# Patient Record
Sex: Male | Born: 1950 | Race: White | Hispanic: No | Marital: Married | Smoking: Current every day smoker
Health system: Southern US, Community
[De-identification: ages and names within clinical notes are randomized; demographics above are authoritative.]

## PROBLEM LIST (undated history)

## (undated) DIAGNOSIS — I2510 Atherosclerotic heart disease of native coronary artery without angina pectoris: Secondary | ICD-10-CM

## (undated) DIAGNOSIS — J449 Chronic obstructive pulmonary disease, unspecified: Secondary | ICD-10-CM

## (undated) DIAGNOSIS — F419 Anxiety disorder, unspecified: Secondary | ICD-10-CM

## (undated) DIAGNOSIS — I714 Abdominal aortic aneurysm, without rupture, unspecified: Secondary | ICD-10-CM

## (undated) DIAGNOSIS — D649 Anemia, unspecified: Secondary | ICD-10-CM

## (undated) DIAGNOSIS — E7800 Pure hypercholesterolemia, unspecified: Secondary | ICD-10-CM

## (undated) DIAGNOSIS — F17200 Nicotine dependence, unspecified, uncomplicated: Secondary | ICD-10-CM

## (undated) DIAGNOSIS — Z87898 Personal history of other specified conditions: Secondary | ICD-10-CM

## (undated) DIAGNOSIS — R0602 Shortness of breath: Secondary | ICD-10-CM

## (undated) DIAGNOSIS — I739 Peripheral vascular disease, unspecified: Secondary | ICD-10-CM

## (undated) DIAGNOSIS — C140 Malignant neoplasm of pharynx, unspecified: Secondary | ICD-10-CM

## (undated) DIAGNOSIS — F32A Depression, unspecified: Secondary | ICD-10-CM

## (undated) DIAGNOSIS — E611 Iron deficiency: Secondary | ICD-10-CM

## (undated) DIAGNOSIS — R011 Cardiac murmur, unspecified: Secondary | ICD-10-CM

## (undated) DIAGNOSIS — F329 Major depressive disorder, single episode, unspecified: Secondary | ICD-10-CM

## (undated) DIAGNOSIS — I219 Acute myocardial infarction, unspecified: Secondary | ICD-10-CM

## (undated) DIAGNOSIS — K8020 Calculus of gallbladder without cholecystitis without obstruction: Secondary | ICD-10-CM

## (undated) DIAGNOSIS — I10 Essential (primary) hypertension: Secondary | ICD-10-CM

## (undated) DIAGNOSIS — Z77120 Contact with and (suspected) exposure to mold (toxic): Secondary | ICD-10-CM

## (undated) HISTORY — DX: Atherosclerotic heart disease of native coronary artery without angina pectoris: I25.10

## (undated) HISTORY — DX: Major depressive disorder, single episode, unspecified: F32.9

## (undated) HISTORY — DX: Personal history of other specified conditions: Z87.898

## (undated) HISTORY — DX: Essential (primary) hypertension: I10

## (undated) HISTORY — DX: Depression, unspecified: F32.A

## (undated) HISTORY — DX: Nicotine dependence, unspecified, uncomplicated: F17.200

## (undated) HISTORY — DX: Chronic obstructive pulmonary disease, unspecified: J44.9

## (undated) HISTORY — PX: OTHER SURGICAL HISTORY: SHX169

## (undated) HISTORY — DX: Pure hypercholesterolemia, unspecified: E78.00

## (undated) HISTORY — DX: Anxiety disorder, unspecified: F41.9

## (undated) HISTORY — DX: Iron deficiency: E61.1

## (undated) HISTORY — DX: Peripheral vascular disease, unspecified: I73.9

## (undated) HISTORY — DX: Abdominal aortic aneurysm, without rupture: I71.4

## (undated) HISTORY — DX: Contact with and (suspected) exposure to mold (toxic): Z77.120

## (undated) HISTORY — DX: Malignant neoplasm of pharynx, unspecified: C14.0

## (undated) HISTORY — PX: ABDOMINAL AORTIC ANEURYSM REPAIR: SUR1152

## (undated) HISTORY — DX: Calculus of gallbladder without cholecystitis without obstruction: K80.20

## (undated) HISTORY — DX: Abdominal aortic aneurysm, without rupture, unspecified: I71.40

## (undated) HISTORY — PX: TONSILLECTOMY: SUR1361

---

## 2006-01-24 ENCOUNTER — Ambulatory Visit: Payer: Self-pay

## 2007-07-31 ENCOUNTER — Ambulatory Visit: Payer: Self-pay | Admitting: Family Medicine

## 2008-06-24 ENCOUNTER — Ambulatory Visit: Payer: Self-pay | Admitting: Family Medicine

## 2009-08-12 ENCOUNTER — Ambulatory Visit: Payer: Self-pay | Admitting: Family Medicine

## 2010-05-12 LAB — PULMONARY FUNCTION TEST

## 2010-12-14 ENCOUNTER — Ambulatory Visit: Payer: Self-pay | Admitting: Ophthalmology

## 2010-12-20 ENCOUNTER — Ambulatory Visit: Payer: Self-pay | Admitting: Ophthalmology

## 2010-12-20 HISTORY — PX: CATARACT EXTRACTION: SUR2

## 2011-01-24 HISTORY — PX: ANGIOPLASTY / STENTING ILIAC: SUR31

## 2011-02-03 ENCOUNTER — Ambulatory Visit: Payer: Self-pay | Admitting: Vascular Surgery

## 2011-02-03 LAB — BASIC METABOLIC PANEL
Anion Gap: 11 (ref 7–16)
Calcium, Total: 9.2 mg/dL (ref 8.5–10.1)
Chloride: 101 mmol/L (ref 98–107)
Co2: 27 mmol/L (ref 21–32)
Creatinine: 0.69 mg/dL (ref 0.60–1.30)
Glucose: 100 mg/dL — ABNORMAL HIGH (ref 65–99)
Potassium: 4 mmol/L (ref 3.5–5.1)
Sodium: 139 mmol/L (ref 136–145)

## 2011-02-03 LAB — CBC
HCT: 49.3 % (ref 40.0–52.0)
HGB: 16.7 g/dL (ref 13.0–18.0)
MCV: 96 fL (ref 80–100)
WBC: 10.7 10*3/uL — ABNORMAL HIGH (ref 3.8–10.6)

## 2011-02-10 ENCOUNTER — Inpatient Hospital Stay: Payer: Self-pay | Admitting: Vascular Surgery

## 2011-02-10 HISTORY — PX: VEIN SURGERY: SHX48

## 2011-02-10 LAB — BASIC METABOLIC PANEL
Anion Gap: 10 (ref 7–16)
BUN: 7 mg/dL (ref 7–18)
Co2: 25 mmol/L (ref 21–32)
Creatinine: 0.73 mg/dL (ref 0.60–1.30)
EGFR (African American): >60
EGFR (Non-African Amer.): >60
Glucose: 140 mg/dL — ABNORMAL HIGH (ref 65–99)
Potassium: 4.5 mmol/L (ref 3.5–5.1)
Sodium: 143 mmol/L (ref 136–145)

## 2011-02-11 LAB — COMPREHENSIVE METABOLIC PANEL
Albumin: 2.9 g/dL — ABNORMAL LOW (ref 3.4–5.0)
Alkaline Phosphatase: 58 U/L (ref 50–136)
BUN: 7 mg/dL (ref 7–18)
Bilirubin,Total: 0.7 mg/dL (ref 0.2–1.0)
Creatinine: 0.64 mg/dL (ref 0.60–1.30)
EGFR (Non-African Amer.): >60
Glucose: 125 mg/dL — ABNORMAL HIGH (ref 65–99)
Osmolality: 284 (ref 275–301)
SGPT (ALT): 10 U/L — ABNORMAL LOW
Sodium: 143 mmol/L (ref 136–145)
Total Protein: 5.7 g/dL — ABNORMAL LOW (ref 6.4–8.2)

## 2011-02-11 LAB — MAGNESIUM: Magnesium: 2 mg/dL

## 2011-02-11 LAB — CBC WITH DIFFERENTIAL/PLATELET
Basophil %: 0.3 %
Eosinophil %: 1.2 %
HGB: 13.3 g/dL (ref 13.0–18.0)
Lymphocyte %: 13.3 %
MCH: 32.4 pg (ref 26.0–34.0)
MCV: 95 fL (ref 80–100)
Monocyte #: 1.4 10*3/uL — ABNORMAL HIGH (ref 0.0–0.7)
Neutrophil %: 74.5 %
RBC: 4.11 10*6/uL — ABNORMAL LOW (ref 4.40–5.90)

## 2011-03-08 ENCOUNTER — Institutional Professional Consult (permissible substitution): Payer: Self-pay | Admitting: Critical Care Medicine

## 2011-03-24 ENCOUNTER — Encounter: Payer: Self-pay | Admitting: Internal Medicine

## 2011-03-27 ENCOUNTER — Ambulatory Visit (INDEPENDENT_AMBULATORY_CARE_PROVIDER_SITE_OTHER): Payer: 59 | Admitting: Pulmonary Disease

## 2011-03-27 ENCOUNTER — Encounter: Payer: Self-pay | Admitting: Pulmonary Disease

## 2011-03-27 DIAGNOSIS — Z720 Tobacco use: Secondary | ICD-10-CM

## 2011-03-27 DIAGNOSIS — J449 Chronic obstructive pulmonary disease, unspecified: Secondary | ICD-10-CM

## 2011-03-27 DIAGNOSIS — F17200 Nicotine dependence, unspecified, uncomplicated: Secondary | ICD-10-CM | POA: Insufficient documentation

## 2011-03-27 DIAGNOSIS — I10 Essential (primary) hypertension: Secondary | ICD-10-CM

## 2011-03-27 DIAGNOSIS — R911 Solitary pulmonary nodule: Secondary | ICD-10-CM

## 2011-03-27 DIAGNOSIS — R05 Cough: Secondary | ICD-10-CM

## 2011-03-27 NOTE — Progress Notes (Signed)
Subjective:    Patient ID: Larry Walters, male    DOB: February 06, 1950, 61 y.o.   MRN: [id number]  HPI This is a pleasant male who was diagnosed with COPD in roughly 2009, who comes to our office for evaluation and management of the same.  He was sent by Dr. Sherrie Mustache to Dr. Donnamarie Poag office in [place], [place].  In April 2012 he had a CT scan performed to evaluate for emphysema.  He had severe diffuse emphysema (by report) and a 3mm pulmonary nodule.  A follow up CT performed in 10/2010 showed no growth in this nodule.   He states that he has been on O2 since June 2012, sleeps with 2 liters a night.  He qualified for exertional oxygen in 04/2010, but then in 11/2010 a repeat study did not demonstrate desaturation below 91% with exercise.  He only really uses the O2 at night. He had a stent in his R pelvic artery in Mar 13, 2011, and AAA repair via stent all at [facility]. In terms of his COPD he notes that his biggest symptoms are shortness of breath, particularly with longer walks, thick, white mucus produciton, and ocassional coughing fits that are very bothersome.  He has been cutting back on cigarettes and notes that the cough has improved greatly with this intervention.   He has never been hospitalized and never been treated for a COPD flare that he knows of.   Past Medical History  Diagnosis Date  . COPD (chronic obstructive pulmonary disease)   . Hypertension   . Mold exposure   . Tobacco dependency      Family History  Problem Relation Age of Onset  . COPD Mother   . Aneurysm Father      History   Social History  . Marital Status: Married    Spouse Name: N/A    Number of Children: 1  . Years of Education: N/A   Occupational History  . Maintenence work    Social History Main Topics  . Smoking status: Current Everyday Smoker -- 0.3 packs/day for 35 years    Types: Cigarettes  . Smokeless tobacco: Never Used  . Alcohol Use: No  . Drug Use: No  . Sexually Active: Not on file   Other  Topics Concern  . Not on file   Social History Narrative  . No narrative on file     Allergies  Allergen Reactions  . Penicillins Hives     No outpatient prescriptions prior to visit.      Review of Systems  Constitutional: Positive for unexpected weight change. Negative for fever, chills, activity change and appetite change.  HENT: Positive for sneezing. Negative for congestion, sore throat, rhinorrhea, trouble swallowing, dental problem, voice change and postnasal drip.   Eyes: Negative for visual disturbance.  Respiratory: Positive for cough and shortness of breath. Negative for choking.   Cardiovascular: Negative for chest pain and leg swelling.  Gastrointestinal: Negative for nausea, vomiting and abdominal pain.  Genitourinary: Negative for difficulty urinating.  Musculoskeletal: Positive for arthralgias.  Skin: Negative for rash.  Psychiatric/Behavioral: Negative for behavioral problems and confusion. The patient is nervous/anxious.        Objective:   Physical Exam Filed Vitals:   03/27/11 1514  BP: 116/60  Pulse: 70  Temp: 97.9 F (36.6 C)  TempSrc: Oral  Height: 5\' 5"  (1.651 m)  Weight: 140 lb (63.504 kg)  SpO2: 95%   Gen: well appearing, no acute distress HEENT: NCAT, PERRL, EOMi, OP clear,  neck supple without masses PULM: Insp crackles L base greater than R, no wheeze CV: RRR, no mgr, no JVD AB: BS+, soft, nontender, no hsm Ext: warm, no edema, no clubbing, no cyanosis Derm: no rash or skin breakdown Neuro: A&Ox4, CN II-XII intact, strength 5/5 in all 4 extremities  Review of studies:  04/2010 Overnight oximetry: spO2 < 90% 66% of time (mostly between 80 and 88%) 04/2010 CT chest without contrast: severe diffuse emphysema, 3mm pleural based RUL nodule 10/2010 CT chest without contrast: severe diffuse emphysema, 3mm pulm nodule not seen Review of PFT 04/2010: Ratio 26%, FEV1 1.1 L (38% pred), TLC 4.1L (75% pred), DLCO 44% pred 04/2010 Barium swallow:  normal esophageal function 06/2010 ABG: 7.41/41/62/26     Assessment & Plan:   COPD (chronic obstructive pulmonary disease) COPD: GOLD Stage III Combined recommendations from the [employer], [employer], Designer, television/film set, European Respiratory Society (Qaseem A et al, Ann Intern Med. 2011;155(3):179) recommends tobacco cessation, pulmonary rehab (for symptomatic patients with an FEV1 < 50% predicted), supplemental oxygen (for patients with SaO2 <88% or paO2 <55), and appropriate bronchodilator therapy.  In regards to long acting bronchodilators, they recommend monotherapy (FEV1 60-80% with symptoms weak evidence, FEV1 with symptoms <60% strong evidence), or combination therapy (FEV1 <60% with symptoms, strong recommendation, moderate evidence).  One should also provide patients with annual immunizations and consider therapy for prevention of COPD exacerbations (ie. roflumilast or azithromycin) when appopriate.  -O2 therapy: used at night -Immunizations: up to date 2013 -Tobacco use: still smoking, advised at length to quit -Exercise: doesn't exercise regularly, encouraged at length and advised pulmonary rehab this visit -Bronchodilator therapy: He is on a reasonable regimen and I recommend no change; He wants to quit the Advair because he doesn't like taking meds; He wants to take it once a day.  I advised that given the severity of his lung disease that this would likely make him cough more and feel more short of breath; -Exacerbation prevention: n/a   Pulmonary nodule A 3mm nodule was seen on the 03/2010 study but not seen on 10/2010 CT thorax.  I agree with radiology that #1 a nodule this size could have been missed due to slice thickness and #2 given that he is a smoker we should still perform surveillance CT's.  Will order a 12 month follow up in 10/2010.  Tobacco dependency Discussed at length today in clinic. We discussed the ongoing  risk of vascular, neoplastic, and pulmonary disease.  He states that he will quit cold [place] this afternoon. He is not interested in taking Chantix or other aids due to side effects. I advised that the side effect profile of Chantix is much better than tobacco!  Cough Due to COPD and ongoing tobacco abuse.  Advised him to quit smoking as noted above.     Updated Medication List Outpatient Encounter Prescriptions as of 03/27/2011  Medication Sig Dispense Refill  . Albuterol Sulfate (PROVENTIL HFA IN) Inhale 2 puffs into the lungs every 6 (six) hours as needed.      . ALPRAZolam (XANAX) 0.5 MG tablet Take 0.5 mg by mouth 2 (two) times daily.      Marland Kitchen aspirin 81 MG tablet Take 81 mg by mouth daily.      . Fluticasone-Salmeterol (ADVAIR DISKUS) 250-50 MCG/DOSE AEPB Inhale 1 puff into the lungs every 12 (twelve) hours.      Marland Kitchen lisinopril (PRINIVIL,ZESTRIL) 20 MG tablet Take 10 mg by mouth daily.      Marland Kitchen  tiotropium (SPIRIVA) 18 MCG inhalation capsule Place 18 mcg into inhaler and inhale daily.

## 2011-03-27 NOTE — Assessment & Plan Note (Signed)
COPD: GOLD Stage III Combined recommendations from the [employer]	[place], [employer], Designer, television/film set, European Respiratory Society (Qaseem A et al, Ann Intern Med. 2011;155(3):179) recommends tobacco cessation, pulmonary rehab (for symptomatic patients with an FEV1 < 50% predicted), supplemental oxygen (for patients with SaO2 <88% or paO2 <55), and appropriate bronchodilator therapy.  In regards to long acting bronchodilators, they recommend monotherapy (FEV1 60-80% with symptoms weak evidence, FEV1 with symptoms <60% strong evidence), or combination therapy (FEV1 <60% with symptoms, strong recommendation, moderate evidence).  One should also provide patients with annual immunizations and consider therapy for prevention of COPD exacerbations (ie. roflumilast or azithromycin) when appopriate.  -O2 therapy: used at night -Immunizations: up to date 2013 -Tobacco use: still smoking, advised at length to quit -Exercise: doesn't exercise regularly, encouraged at length and advised pulmonary rehab this visit -Bronchodilator therapy: He is on a reasonable regimen and I recommend no change; He wants to quit the Advair because he doesn't like taking meds; He wants to take it once a day.  I advised that given the severity of his lung disease that this would likely make him cough more and feel more short of breath; -Exacerbation prevention: n/a

## 2011-03-27 NOTE — Patient Instructions (Signed)
We will refer you to pulmonary rehab at [facility]. Quit smoking! Continue taking the Spiriva, Advair, and albuterol as you are doing. You can try to decrease the advair to once a day, but it will likely make you cough more and feel more short of breath.  We recommend that if you make this change, you only do so if you quit smoking and start exercising.  We will see you back in 3 months.  At that visit we will order a CT scan to follow up the nodule (scan to be performed in 10/2011).

## 2011-03-27 NOTE — Assessment & Plan Note (Signed)
Discussed at length today in clinic. We discussed the ongoing risk of vascular, neoplastic, and pulmonary disease.  He states that he will quit cold [place] this afternoon. He is not interested in taking Chantix or other aids due to side effects. I advised that the side effect profile of Chantix is much better than tobacco!

## 2011-03-27 NOTE — Assessment & Plan Note (Signed)
A 3mm nodule was seen on the 03/2010 study but not seen on 10/2010 CT thorax.  I agree with radiology that #1 a nodule this size could have been missed due to slice thickness and #2 given that he is a smoker we should still perform surveillance CT's.  Will order a 12 month follow up in 10/2010.

## 2011-03-27 NOTE — Assessment & Plan Note (Signed)
Due to COPD and ongoing tobacco abuse.  Advised him to quit smoking as noted above.

## 2011-03-28 ENCOUNTER — Telehealth: Payer: Self-pay | Admitting: Pulmonary Disease

## 2011-03-28 MED ORDER — VARENICLINE TARTRATE 1 MG PO TABS: 1.0000 mg | ORAL_TABLET | Freq: Two times a day (BID) | ORAL | Status: AC

## 2011-03-28 MED ORDER — VARENICLINE TARTRATE 0.5 MG X 11 & 1 MG X 42 PO MISC: ORAL | Status: AC

## 2011-03-28 NOTE — Telephone Encounter (Signed)
[facility] needs pft results faxed .  Checking with Verlon Au to see if she has these available.

## 2011-03-28 NOTE — Telephone Encounter (Signed)
PT decided that he wants to try Chantix.  Pt informed that med was sent to pharmacy .  Pt is to call in a couple weeks to give update on how he is doing with med.

## 2011-03-28 NOTE — Telephone Encounter (Signed)
PFT results were faxed to [facility].

## 2011-04-13 ENCOUNTER — Encounter: Payer: Self-pay | Admitting: Gastroenterology

## 2011-05-02 ENCOUNTER — Ambulatory Visit: Payer: PRIVATE HEALTH INSURANCE | Admitting: Gastroenterology

## 2011-05-04 ENCOUNTER — Other Ambulatory Visit (INDEPENDENT_AMBULATORY_CARE_PROVIDER_SITE_OTHER): Payer: PRIVATE HEALTH INSURANCE

## 2011-05-04 ENCOUNTER — Encounter: Payer: Self-pay | Admitting: Gastroenterology

## 2011-05-04 ENCOUNTER — Ambulatory Visit (INDEPENDENT_AMBULATORY_CARE_PROVIDER_SITE_OTHER): Payer: PRIVATE HEALTH INSURANCE | Admitting: Gastroenterology

## 2011-05-04 DIAGNOSIS — I739 Peripheral vascular disease, unspecified: Secondary | ICD-10-CM

## 2011-05-04 DIAGNOSIS — R195 Other fecal abnormalities: Secondary | ICD-10-CM

## 2011-05-04 DIAGNOSIS — K5900 Constipation, unspecified: Secondary | ICD-10-CM

## 2011-05-04 DIAGNOSIS — R634 Abnormal weight loss: Secondary | ICD-10-CM

## 2011-05-04 DIAGNOSIS — J438 Other emphysema: Secondary | ICD-10-CM

## 2011-05-04 DIAGNOSIS — F17200 Nicotine dependence, unspecified, uncomplicated: Secondary | ICD-10-CM

## 2011-05-04 LAB — BASIC METABOLIC PANEL
Chloride: 99 mEq/L (ref 96–112)
Creatinine, Ser: 0.7 mg/dL (ref 0.4–1.5)
GFR: 116.63 mL/min (ref >60.00)

## 2011-05-04 LAB — CBC WITH DIFFERENTIAL/PLATELET
Basophils Relative: 1.1 % (ref 0.0–3.0)
Eosinophils Relative: 1.2 % (ref 0.0–5.0)
HCT: 48.6 % (ref 39.0–52.0)
Hemoglobin: 16.3 g/dL (ref 13.0–17.0)
Lymphs Abs: 2 10*3/uL (ref 0.7–4.0)
MCV: 95.1 fl (ref 78.0–100.0)
Monocytes Absolute: 0.9 10*3/uL (ref 0.1–1.0)
Neutro Abs: 10.6 10*3/uL — ABNORMAL HIGH (ref 1.4–7.7)
Platelets: 270 10*3/uL (ref 150.0–400.0)
RBC: 5.11 Mil/uL (ref 4.22–5.81)
WBC: 13.8 10*3/uL — ABNORMAL HIGH (ref 4.5–10.5)

## 2011-05-04 LAB — TSH: TSH: 1.08 u[IU]/mL (ref 0.35–5.50)

## 2011-05-04 LAB — HEPATIC FUNCTION PANEL
AST: 19 U/L (ref 0–37)
Total Bilirubin: 0.4 mg/dL (ref 0.3–1.2)

## 2011-05-04 LAB — IBC PANEL: Saturation Ratios: 17.8 % — ABNORMAL LOW (ref 20.0–50.0)

## 2011-05-04 LAB — VITAMIN B12: Vitamin B-12: 260 pg/mL (ref 211–911)

## 2011-05-04 MED ORDER — LINACLOTIDE 290 MCG PO CAPS: 1.0000 | ORAL_CAPSULE | Freq: Every day | ORAL | Status: DC

## 2011-05-04 MED ORDER — POLYETHYLENE GLYCOL 3350 17 GM/SCOOP PO POWD: 17.0000 g | Freq: Every day | ORAL | Status: AC

## 2011-05-04 NOTE — Progress Notes (Signed)
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 He has vague lower bowel mild discomfort but denies nausea vomiting, melena, significant upper gastrointestinal or hepatobiliary complaints. Previous chest CT scan showed evidence of nephrolithiasis and cholelithiasis. He's had mild anorexia and progressive weight loss related to his COPD, and is home oxygen dependent. He cannot walk more than 2 blocks at a time. He occasionally has acid reflux symptoms but denies dysphagia. Review of his record shows a previous normal barium swallow. He continues to smoke heavily but denies ethanol abuse. Apparently he has been seen previously [facility] by Dr. Mechele Collin who apparently refused performed colonoscopy. In addition, the patient has hypertensive cardiovascular disease, but denies previous MIs, CVAs, and is not on anticoagulants. He does take aspirin 81 mg a day.  I have reviewed this patient's present history, medical and surgical past history, allergies and medications.     ROS: The remainder of the 10 point ROS is negative... he denies angina, abdominal pain with eating, recurrent claudications.  Allergies  Allergen Reactions  . Penicillins Hives   Outpatient Prescriptions Prior to Visit  Medication Sig Dispense Refill  . Albuterol Sulfate (PROVENTIL HFA IN) Inhale 2 puffs into the lungs every 6 (six) hours as needed.      . ALPRAZolam (XANAX) 0.5 MG tablet Take 0.5 mg by mouth 2 (two) times daily.      Marland Kitchen aspirin 81 MG tablet Take 81 mg by mouth daily.      . Fluticasone-Salmeterol (ADVAIR DISKUS) 250-50 MCG/DOSE AEPB Inhale 1 puff into the lungs every 12 (twelve) hours.        Marland Kitchen lisinopril (PRINIVIL,ZESTRIL) 20 MG tablet Take 10 mg by mouth daily.      Marland Kitchen tiotropium (SPIRIVA) 18 MCG inhalation capsule Place 18 mcg into inhaler and inhale daily.      . varenicline (CHANTIX CONTINUING MONTH PAK) 1 MG tablet Take 1 tablet (1 mg total) by mouth 2 (two) times daily.  60 tablet  3  . varenicline (CHANTIX STARTING MONTH PAK) 0.5 MG X 11 & 1 MG X 42 tablet Take one 0.5 mg tablet by mouth once daily for 3 days, then increase to one 0.5 mg tablet twice daily for 4 days, then increase to one 1 mg tablet twice daily.  53 tablet  0   Past Medical History  Diagnosis Date  . COPD (chronic obstructive pulmonary disease)   . Hypertension   . Mold exposure   . Tobacco dependency   . Anxiety   . Hypercholesteremia    Past Surgical History  Procedure Date  . Coronary angioplasty with stent placement 03-12-11  . Cataract extraction 12/20/2010    left eye  . Vein surgery 02/10/2011   History   Social History  . Marital Status: Married    Spouse Name: N/A    Number of Children: 1  . Years of Education: N/A   Occupational History  . Maintenence work    Social History Main Topics  . Smoking status: Current Everyday Smoker -- 0.3 packs/day for 35 years    Types: Cigarettes  . Smokeless tobacco: Never Used  . Alcohol Use: No  . Drug Use: No  . Sexually Active: None   Other Topics Concern  .  None   Social History Narrative  . None   Family History  Problem Relation Age of Onset  . COPD Mother   . Aneurysm Father         Physical Exam: Chronically ill-appearing patient in no acute distress. Blood pressure 110/60, pulse 92 and regular, and weight 142 pounds with BMI of 23.73. General well developed well nourished patient in no acute distress, appearing their stated age Eyes PERRLA, no icterus, fundoscopic exam per opthamologist Skin no lesions noted Neck supple, no adenopathy, no thyroid enlargement, no tenderness Chest diminished breath sounds in both lung  fields without definite areas of consolidation. Heart no significant murmurs, gallops or rubs noted Abdomen no hepatosplenomegaly masses or tenderness, BS normal. Perianal skin tags noted. Rectal exam shows some chronic anal deformity but no definite stenosis. There is no evidence of a rectal impaction. Stool is present which is trace guaiac positive. There are bilateral lower abdominal prominent bruits.  Extremities no acute joint lesions, edema, phlebitis or evidence of cellulitis. Neurologic patient oriented x 3, cranial nerves intact, no focal neurologic deficits noted. Psychological mental status normal and normal affect.  Assessment and plan: Unfortunate patient with severe emphysema and peripheral vascular disease who continues to smoke heavily. He is home oxygen dependent and is a poor candidate for conscious sedation and colonoscopy, but this may need to be performed in the hospital setting. I have ordered labs for review, and placed him on Linzess 290 mg a day and MiraLax 8 ounces at bedtime. I will see him back in several weeks' time for review and decision about colonoscopy exam. He otherwise is to continue his medications as listed and reviewed. He has gallstones which are asymptomatic.  Encounter Diagnosis  Name Primary?  . Constipation Yes

## 2011-05-04 NOTE — Patient Instructions (Signed)
Make an office visit to come back and see Dr Jarold Motto in 3 weeks.  Take Linzess one tablet by mouth once a day on a empty stomach, samples given. Drink one capful of Miralax mixed in 8 ounces of water every might, you can buy Miralax OTC Coupon given Please go to the basement today for your labs.

## 2011-05-10 ENCOUNTER — Telehealth: Payer: Self-pay | Admitting: Gastroenterology

## 2011-05-10 NOTE — Telephone Encounter (Signed)
Went over everything with wife.

## 2011-05-25 ENCOUNTER — Ambulatory Visit: Payer: PRIVATE HEALTH INSURANCE | Admitting: Gastroenterology

## 2011-05-25 ENCOUNTER — Telehealth: Payer: Self-pay | Admitting: Gastroenterology

## 2011-05-25 NOTE — Telephone Encounter (Signed)
Dr Jarold Motto, any actions or orders on lab results from 05/04/11? Thanks.

## 2011-05-25 NOTE — Telephone Encounter (Signed)
Called pt to inform him of his lab results and to inform him he needs Vit B12 injections. Dr Jarold Motto normally does 1 injection weekly for 3 weeks and then monthly x 1 year. Pt requested I fax this to Dr Benancio Deeds at [facility] for him to review the labs and have them possibly do the injection. Faxed to [phone].

## 2011-05-25 NOTE — Telephone Encounter (Signed)
Needs B12 shots

## 2011-05-26 ENCOUNTER — Telehealth: Payer: Self-pay | Admitting: *Deleted

## 2011-05-26 NOTE — Telephone Encounter (Signed)
Pt called to report he spoke with Dr Benancio Deeds and they never received our fax. Explained to pt I faxed and refaxed at least 5-6 times yesterday, called and verified the number and called to see if their fax was working. I finally got it to go through at almost 5pm. [facility], [phone] and they stated they had had problems with the fax. Faxed info again and he will decide if he will get B12 injections at the PCP ofc.

## 2011-06-16 ENCOUNTER — Telehealth: Payer: Self-pay | Admitting: Pulmonary Disease

## 2011-06-16 NOTE — Telephone Encounter (Signed)
I spoke with pt and he states he is having increase SOB w/ exertion and his [facility] nurse came out and checked his pulse and it was 130. I offered pt an apt to come in and be seen in [place] office but stated he did not want to come this far. He advised me he will call his pcp to see if they will see him but if not then he is going to UC/ed.

## 2011-06-19 LAB — CBC
HCT: 46 % (ref 40.0–52.0)
HGB: 15.5 g/dL (ref 13.0–18.0)
MCH: 31.8 pg (ref 26.0–34.0)
MCHC: 33.8 g/dL (ref 32.0–36.0)
MCV: 94 fL (ref 80–100)
RBC: 4.89 10*6/uL (ref 4.40–5.90)
RDW: 14.5 % (ref 11.5–14.5)

## 2011-06-19 LAB — BASIC METABOLIC PANEL
Anion Gap: 7 (ref 7–16)
BUN: 14 mg/dL (ref 7–18)
Calcium, Total: 8.3 mg/dL — ABNORMAL LOW (ref 8.5–10.1)
Glucose: 111 mg/dL — ABNORMAL HIGH (ref 65–99)
Osmolality: 279 (ref 275–301)
Potassium: 3.8 mmol/L (ref 3.5–5.1)
Sodium: 139 mmol/L (ref 136–145)

## 2011-06-19 LAB — TROPONIN I: Troponin-I: <0.02 ng/mL

## 2011-06-19 LAB — CK TOTAL AND CKMB (NOT AT ARMC): CK-MB: 2.3 ng/mL (ref 0.5–3.6)

## 2011-06-20 ENCOUNTER — Inpatient Hospital Stay: Payer: Self-pay | Admitting: Student

## 2011-06-21 LAB — CBC WITH DIFFERENTIAL/PLATELET
Basophil %: 0.1 %
Eosinophil #: 0 10*3/uL (ref 0.0–0.7)
Eosinophil %: 0.1 %
HCT: 42.2 % (ref 40.0–52.0)
MCH: 30.7 pg (ref 26.0–34.0)
MCHC: 32.5 g/dL (ref 32.0–36.0)
MCV: 94 fL (ref 80–100)
Monocyte #: 0.6 x10 3/mm (ref 0.2–1.0)
Neutrophil %: 90.8 %
Platelet: 222 10*3/uL (ref 150–440)
RDW: 14.4 % (ref 11.5–14.5)
WBC: 18.1 10*3/uL — ABNORMAL HIGH (ref 3.8–10.6)

## 2011-07-03 ENCOUNTER — Ambulatory Visit (INDEPENDENT_AMBULATORY_CARE_PROVIDER_SITE_OTHER): Payer: PRIVATE HEALTH INSURANCE | Admitting: Pulmonary Disease

## 2011-07-03 ENCOUNTER — Encounter: Payer: Self-pay | Admitting: Pulmonary Disease

## 2011-07-03 VITALS — BP 112/60 | HR 82 | Temp 98.0°F | Ht 65.0 in | Wt 143.0 lb

## 2011-07-03 DIAGNOSIS — F329 Major depressive disorder, single episode, unspecified: Secondary | ICD-10-CM

## 2011-07-03 DIAGNOSIS — J449 Chronic obstructive pulmonary disease, unspecified: Secondary | ICD-10-CM

## 2011-07-03 MED ORDER — ROFLUMILAST 500 MCG PO TABS: 500.0000 ug | ORAL_TABLET | Freq: Every day | ORAL | Status: DC

## 2011-07-03 NOTE — Patient Instructions (Signed)
Start taking roflumilast one pill daily for COPD.  Do not use this as needed.  Use it every day no matter how you feel. If it gives you an upset stomach then take it every other day for two weeks, then resume taking it daily. Keep using the spiriva daily and the Advair twice per day. Keep using the albuterol as needed. Follow up with pulmonary rehab. Follow up with mental health as scheduled. If you find that you are feeling extremely depressed or have thoughts of hurting yourself, call either 911 or the National Suicide Prevention Hotline at [phone] We will see you back in 4-6 weeks, overbook OK

## 2011-07-03 NOTE — Assessment & Plan Note (Signed)
Mr. Lorimer states that he has been feeling better with less wheezing since his hospitalization last week at [facility] but he has been noting significant dyspnea on exertion with minimal effort.  He has been taking his meds well.  He continues to smoke which is making his COPD worse.   Plan: -start roflumilast -instructed to quit smoking in a few weeks (see below) -keep taking tiotropium and advair -cont with plans for pulm rehab next week -f/u with [place] in 4-6 weeks, overbook OK

## 2011-07-03 NOTE — Progress Notes (Signed)
Subjective:    Patient ID: Larry Walters, male    DOB: 11-20-1950, 61 y.o.   MRN: [id number]  Synopsis: Larry Walters was first evaluated by the [facility] [place] [facility] in the Spring of 2013 for COPD.  He has smoked 1/3 ppd for 10.5 years and continues to smoke.  GOLD Grade D.  He was hospitalized for a flare in June 2013 at [facility].  HPI 07/03/11 ROV -- Since the last visit Larry Walters was hospitalized at [facility] for a COPD flare.  He stayed in the hospital for four days and was treated with prednisone and antibiotics.  His breathing difficulty and wheezing have improved since then.  He continues to smoke.  He has been using his Advair twice per day.  He does not have significant chest pain, but notes some chest pressure with exertion.  He uses oxygen with exertion regularly.  He is awaiting pulmonary rehab on July 16th.  He has been having a lot of depression symptoms recently as well as anxiety.  He states that a lot of his depression is based in his chronic everyday dyspnea.  He is not ready to quit smoking.  Past Medical History  Diagnosis Date  . COPD (chronic obstructive pulmonary disease)   . Hypertension   . Mold exposure   . Tobacco dependency   . Anxiety   . Hypercholesteremia      Review of Systems  HENT: Negative for congestion, rhinorrhea, sneezing and postnasal drip.   Respiratory: Positive for cough, shortness of breath and wheezing.   Cardiovascular: Negative for chest pain and leg swelling.       Objective:   Physical Exam  Filed Vitals:   07/03/11 1351  BP: 112/60  Pulse: 82  Temp: 98 F (36.7 C)  TempSrc: Oral  Height: 5\' 5"  (1.651 m)  Weight: 143 lb (64.864 kg)  SpO2: 96%   Gen: chronically ill appearing, no acute distress HEENT: NCAT, PERRL, EOMi, OP clear, neck supple without masses PULM: Poor air movement bilatearlly, some crackles in bases CV: RRR, no mgr, no JVD AB: BS+, soft, nontender, no hsm Ext: warm, no edema, no clubbing, no  cyanosis  04/2010 PFT from [facility] Pulmonologist: FEV1 34% predicted, DLCO 44% predicted     Assessment & Plan:   COPD (chronic obstructive pulmonary disease) Larry Walters states that he has been feeling better with less wheezing since his hospitalization last week at [facility] but he has been noting significant dyspnea on exertion with minimal effort.  He has been taking his meds well.  He continues to smoke which is making his COPD worse.   Plan: -start roflumilast -instructed to quit smoking in a few weeks (see below) -keep taking tiotropium and advair -cont with plans for pulm rehab next week -f/u with [place] in 4-6 weeks, overbook OK  Depression Larry Walters has requested a referral to Mental Health for his depression.  His COPD seems to be exacerbating his depression, but I believe that his depression is making him magnify the severity of his COPD symptoms.  He notes that he has been thinking about his own death from COPD, and has thought about suicide.  However he states that he does not have a plan and has not been seriously considering it.  His thought process is clear and rational today and he contracts for safety with me and states that he will call 911 or the suicidal hotline if his thoughts get worse.   We we were able to set up  an appointment for him to be seen tomorrow (6/11) with [place] [facility].    Updated Medication List Outpatient Encounter Prescriptions as of 07/03/2011  Medication Sig Dispense Refill  . Albuterol Sulfate (PROVENTIL HFA IN) Inhale 2 puffs into the lungs every 6 (six) hours as needed.      Marland Kitchen alprazolam (XANAX) 2 MG tablet Take 2 mg by mouth 2 (two) times daily as needed.      Marland Kitchen aspirin 81 MG tablet Take 81 mg by mouth daily.      . Fluticasone-Salmeterol (ADVAIR DISKUS) 250-50 MCG/DOSE AEPB Inhale 1 puff into the lungs every 12 (twelve) hours.      Marland Kitchen lisinopril (PRINIVIL,ZESTRIL) 20 MG tablet Take 10 mg by mouth daily.      Marland Kitchen tiotropium (SPIRIVA) 18 MCG  inhalation capsule Place 18 mcg into inhaler and inhale daily.      . roflumilast (DALIRESP) 500 MCG TABS tablet Take 1 tablet (500 mcg total) by mouth daily.  30 tablet  2  . DISCONTD: ALPRAZolam (XANAX) 0.5 MG tablet Take 0.5 mg by mouth 2 (two) times daily.      Marland Kitchen DISCONTD: Linaclotide (LINZESS) 290 MCG CAPS Take 1 capsule by mouth daily.  30 capsule  0

## 2011-07-03 NOTE — Assessment & Plan Note (Signed)
Larry Walters has requested a referral to Mental Health for his depression.  His COPD seems to be exacerbating his depression, but I believe that his depression is making him magnify the severity of his COPD symptoms.  He notes that he has been thinking about his own death from COPD, and has thought about suicide.  However he states that he does not have a plan and has not been seriously considering it.  His thought process is clear and rational today and he contracts for safety with me and states that he will call 911 or the suicidal hotline if his thoughts get worse.   We we were able to set up an appointment for him to be seen tomorrow (6/11) with [place] [facility].

## 2011-07-04 ENCOUNTER — Telehealth: Payer: Self-pay | Admitting: Family Medicine

## 2011-07-04 ENCOUNTER — Ambulatory Visit (INDEPENDENT_AMBULATORY_CARE_PROVIDER_SITE_OTHER): Payer: PRIVATE HEALTH INSURANCE | Admitting: Psychology

## 2011-07-04 DIAGNOSIS — F4323 Adjustment disorder with mixed anxiety and depressed mood: Secondary | ICD-10-CM

## 2011-07-05 ENCOUNTER — Telehealth: Payer: Self-pay | Admitting: Pulmonary Disease

## 2011-07-05 NOTE — Telephone Encounter (Signed)
Please let him know that the alternative is taking an antibiotic (azithromycin 250mg  po daily) which is cheaper but requires blood work every three months and an EKG to monitor for a heart rhythm abnormality.  If he wants this I can change.

## 2011-07-05 NOTE — Telephone Encounter (Signed)
Spoke with Dr Kendrick Fries more about this and he decided okay to give samples of daliresp until the pt's next appt in July and at that time will discuss azithromycin and will do labs and baseline ECG at that time.   I called and spoke with pt and he was okay with this. States will pick up samples from [facility] this afternoon- 4 wks worth given. Will keep July ov for labs, ECG. Pt states nothing further needed.

## 2011-07-05 NOTE — Telephone Encounter (Signed)
Pt called wanted dr Kendrick Fries to call him  About rx dalire sp 500 mg  This was $95  Is there something else he could take

## 2011-07-07 ENCOUNTER — Emergency Department: Payer: Self-pay | Admitting: Emergency Medicine

## 2011-07-10 ENCOUNTER — Telehealth: Payer: Self-pay | Admitting: Pulmonary Disease

## 2011-07-10 DIAGNOSIS — I2510 Atherosclerotic heart disease of native coronary artery without angina pectoris: Secondary | ICD-10-CM

## 2011-07-10 NOTE — Telephone Encounter (Signed)
Would recommend cardiology referral to Drs. Arida and Vander.  Please send referral.

## 2011-07-10 NOTE — Telephone Encounter (Signed)
Spoke with pt was seen in [place] er on 07-07-11 due to uncontrolled nose bleed. Pt was on plavix 75 mg and as 81mg . ER stopped this. Pt also stopped his dilresp. Pt wants to know if you can refer him to a new heart dr or vein/vascular DR. PT doesn't not want to go back an see DR Kenard Gower due to trust issues.  Dr Kendrick Fries  Please advise.

## 2011-07-11 NOTE — Telephone Encounter (Signed)
Spoke with pt and notified of recs per BQ. He verbalized understanding and denied any questions/further needs. Order was sent to [facility].

## 2011-07-14 ENCOUNTER — Other Ambulatory Visit: Payer: PRIVATE HEALTH INSURANCE

## 2011-07-14 ENCOUNTER — Ambulatory Visit (INDEPENDENT_AMBULATORY_CARE_PROVIDER_SITE_OTHER): Payer: PRIVATE HEALTH INSURANCE | Admitting: Cardiovascular Disease

## 2011-07-14 ENCOUNTER — Other Ambulatory Visit (INDEPENDENT_AMBULATORY_CARE_PROVIDER_SITE_OTHER): Payer: PRIVATE HEALTH INSURANCE

## 2011-07-14 ENCOUNTER — Encounter: Payer: Self-pay | Admitting: Cardiovascular Disease

## 2011-07-14 VITALS — BP 122/70 | HR 80 | Ht 65.0 in | Wt 138.8 lb

## 2011-07-14 DIAGNOSIS — I714 Abdominal aortic aneurysm, without rupture, unspecified: Secondary | ICD-10-CM

## 2011-07-14 DIAGNOSIS — R0989 Other specified symptoms and signs involving the circulatory and respiratory systems: Secondary | ICD-10-CM

## 2011-07-14 DIAGNOSIS — R079 Chest pain, unspecified: Secondary | ICD-10-CM

## 2011-07-14 DIAGNOSIS — I739 Peripheral vascular disease, unspecified: Secondary | ICD-10-CM

## 2011-07-14 DIAGNOSIS — R0609 Other forms of dyspnea: Secondary | ICD-10-CM

## 2011-07-14 DIAGNOSIS — R0600 Dyspnea, unspecified: Secondary | ICD-10-CM

## 2011-07-14 NOTE — Patient Instructions (Addendum)
Your physician has requested that you have an echocardiogram. Echocardiography is a painless test that uses sound waves to create images of your heart. It provides your doctor with information about the size and shape of your heart and how well your heart's chambers and valves are working. This procedure takes approximately one hour. There are no restrictions for this procedure.  Your physician has requested that you have en exercise stress myoview. For further information please visit [email or web address] Please follow instruction sheet, as given.  Labs today.   Follow up after tests

## 2011-07-15 LAB — CBC WITH DIFFERENTIAL/PLATELET
Basophils Absolute: 0.1 10*3/uL (ref 0.0–0.2)
Eosinophils Absolute: 0.4 10*3/uL (ref 0.0–0.4)
Hemoglobin: 15 g/dL (ref 12.6–17.7)
Lymphs: 21 % (ref 14–46)
MCH: 32.7 pg (ref 26.6–33.0)
MCHC: 35.2 g/dL (ref 31.5–35.7)
Neutrophils Absolute: 5.9 10*3/uL (ref 1.8–7.8)
Neutrophils Relative %: 63 % (ref 40–74)
RDW: 13.9 % (ref 12.3–15.4)

## 2011-07-16 ENCOUNTER — Encounter: Payer: Self-pay | Admitting: Cardiovascular Disease

## 2011-07-16 DIAGNOSIS — I714 Abdominal aortic aneurysm, without rupture, unspecified: Secondary | ICD-10-CM | POA: Insufficient documentation

## 2011-07-16 DIAGNOSIS — R0600 Dyspnea, unspecified: Secondary | ICD-10-CM | POA: Insufficient documentation

## 2011-07-16 DIAGNOSIS — I739 Peripheral vascular disease, unspecified: Secondary | ICD-10-CM | POA: Insufficient documentation

## 2011-07-16 DIAGNOSIS — R079 Chest pain, unspecified: Secondary | ICD-10-CM | POA: Insufficient documentation

## 2011-07-16 NOTE — Assessment & Plan Note (Signed)
Status post endovascular repair by Dr. Wyn Quaker in January of 2013.

## 2011-07-16 NOTE — Assessment & Plan Note (Signed)
I would be happy to follow the patient for this as he is thinking about transferring care. If he decides, he will need aortoiliac duplex with lower extremity arterial duplex ultrasound for evaluation given his symptoms of claudication. He will likely require carotid duplex ultrasound as well. Regarding the issue of Plavix, I encouraged him to go back on it once the nose bleed situation is under control.

## 2011-07-16 NOTE — Assessment & Plan Note (Signed)
He has significant dyspnea likely due to COPD. I recommend an echocardiogram to evaluate diastolic function and pulmonary pressure.

## 2011-07-16 NOTE — Progress Notes (Signed)
HPI  This is a 61 year old man who is here today to establish cardiovascular care. The patient has extensive medical problems. He has severe COPD which is being managed by Dr. Kendrick Fries. 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 He had endovascular repair done in January of 2013 by Dr. dew. He has seen multiple clinical providers. Before the change in his insurance, he saw Dr. Hyacinth Meeker in [place]. It appears that he had no cardiac testing done but he did have an ABI which was abnormal as expected based on his known history of PAD. He informs me that he also saw Dr. Juliann Pares for exertional dyspnea and tachycardia. He was given diltiazem that the patient was very hesitant to take that medication due to his perception that it would worsen his COPD. He has been on Plavix for peripheral arterial disease. Recently had significant nosebleed that required emergency room visit. It was packed and he is supposed to follow with Dr. Elenore Rota for that reason. He has taken himself off Plavix since then for that reason. Some of the bleeding was attributed to continuous use of dry oxygen. The patient reports significant exertional dyspnea with occasional chest tightness and significant tachycardia with activities. He had multiple admissions for COPD. He is not aware of previous cardiac evaluation other than ECG.. The patient continues to smoke.  Allergies  Allergen Reactions  . Citalopram   . Mirtazapine   . Penicillins Hives  . Sertraline Hcl      Current Outpatient Prescriptions on File Prior to Visit  Medication Sig Dispense Refill  . Albuterol Sulfate (PROVENTIL HFA IN) Inhale 2 puffs into the lungs every 6 (six) hours as needed.      Marland Kitchen alprazolam (XANAX) 2 MG tablet Take 2 mg by mouth 2 (two) times daily as needed.      Marland Kitchen aspirin 81 MG tablet Take 81 mg by mouth daily.      . Fluticasone-Salmeterol (ADVAIR DISKUS) 250-50 MCG/DOSE AEPB Inhale 1 puff into the lungs  every 12 (twelve) hours.      Marland Kitchen lisinopril (PRINIVIL,ZESTRIL) 20 MG tablet Take 10 mg by mouth daily.      . roflumilast (DALIRESP) 500 MCG TABS tablet Take 1 tablet (500 mcg total) by mouth daily.  30 tablet  2  . tiotropium (SPIRIVA) 18 MCG inhalation capsule Place 18 mcg into inhaler and inhale daily.         Past Medical History  Diagnosis Date  . COPD (chronic obstructive pulmonary disease)   . Mold exposure   . Tobacco dependency   . Anxiety   . Hypercholesteremia   . COPD (chronic obstructive pulmonary disease)   . AAA (abdominal aortic aneurysm)     s/p endovascular repair by Dr. Wyn Quaker in 01/2011  . Hypertension      Past Surgical History  Procedure Date  . Cataract extraction 12/20/2010    left eye  . Vein surgery 02/10/2011  . Abdominal aortic aneurysm repair      Family History  Problem Relation Age of Onset  . COPD Mother   . Aneurysm Father      History   Social History  . Marital Status: Married    Spouse Name: N/A    Number of Children: 1  . Years of Education: N/A   Occupational History  . Maintenence work    Social History Main Topics  . Smoking status: Current Everyday Smoker -- 0.3 packs/day for 35 years  Types: Cigarettes  . Smokeless tobacco: Never Used  . Alcohol Use: No  . Drug Use: No  . Sexually Active: Not on file   Other Topics Concern  . Not on file   Social History Narrative  . No narrative on file     ROS Constitutional: Negative for fever, chills, diaphoresis, activity change, appetite change.  HENT: Negative for hearing loss,  congestion, sore throat, facial swelling, drooling, trouble swallowing, neck pain, voice change, sinus pressure and tinnitus.  Eyes: Negative for photophobia, pain, discharge and visual disturbance.  Respiratory: Negative for apnea, cough and wheezing.  Cardiovascular: Negative for leg swelling.  Gastrointestinal: Negative for nausea, vomiting, abdominal pain, diarrhea, constipation, blood in  stool and abdominal distention.  Genitourinary: Negative for dysuria, urgency, frequency, hematuria and decreased urine volume.  Musculoskeletal: Negative for myalgias, back pain, joint swelling, arthralgias and gait problem.  Skin: Negative for color change, pallor, rash and wound.  Neurological: Negative for dizziness, tremors, seizures, syncope, speech difficulty, weakness, light-headedness, numbness and headaches.      PHYSICAL EXAM   BP 122/70  Pulse 80  Ht 5\' 5"  (1.651 m)  Wt 138 lb 12 oz (62.937 kg)  BMI 23.09 kg/m2 Constitutional: He is oriented to person, place, and time. He appears significantly older than his stated age but No distress.  HENT: No nasal discharge.  Head: Normocephalic and atraumatic.  Eyes: Pupils are equal and round. Right eye exhibits no discharge. Left eye exhibits no discharge.  Neck: Normal range of motion. Neck supple. No JVD present. No thyromegaly present.  Cardiovascular: Normal rate, regular rhythm, normal heart sounds and. Exam reveals no gallop and no friction rub. No murmur heard.  Pulmonary/Chest: Effort normal and diminished breath sounds. No stridor. No respiratory distress. He has mild wheezes. He has no rales. He exhibits no tenderness.  Abdominal: Soft. Bowel sounds are normal. He exhibits no distension. There is no tenderness. There is no rebound and no guarding.  Musculoskeletal: Normal range of motion. He exhibits no edema and no tenderness.  Neurological: He is alert and oriented to person, place, and time. Coordination normal.  Skin: Skin is warm and dry. No rash noted. He is not diaphoretic. No erythema. No pallor.  Psychiatric: He has a normal mood and affect. His behavior is normal. Judgment and thought content normal.       EKG: Normal sinus rhythm with right bundle branch block.   ASSESSMENT AND PLAN

## 2011-07-16 NOTE — Assessment & Plan Note (Signed)
The patient reports frequent exertional chest tightness worrisome for angina. These symptoms could be also due to his known history of COPD but obviously he is at significant risk of coronary artery disease due to his multiple risk factors and already established atherosclerosis with significant PAD. Thus, I recommend evaluation with a treadmill nuclear stress test. I would have a low threshold for performing cardiac catheterization preferably through the radial approach.

## 2011-07-18 ENCOUNTER — Ambulatory Visit: Payer: PRIVATE HEALTH INSURANCE | Admitting: Psychology

## 2011-07-18 NOTE — Telephone Encounter (Signed)
error 

## 2011-07-19 ENCOUNTER — Telehealth: Payer: Self-pay

## 2011-07-19 NOTE — Telephone Encounter (Signed)
Pt's wife says pt went to ENT this am for nosebleed/cautery.  Pt is no longer bleeding. ENT asks if we should hold ASA 81 mg.  I advised against this, that pt should continue taking ASA as directed but if nose bleed recurs, pt may call us back.  Understanding verb.

## 2011-07-19 NOTE — Telephone Encounter (Signed)
Ok to hold Aspirin for few days until bleeding stops.

## 2011-07-20 NOTE — Telephone Encounter (Signed)
Explained to pt. Understanding verb.

## 2011-07-20 NOTE — Telephone Encounter (Signed)
LMTCB

## 2011-07-21 ENCOUNTER — Ambulatory Visit: Payer: Self-pay | Admitting: Cardiovascular Disease

## 2011-07-31 ENCOUNTER — Ambulatory Visit (INDEPENDENT_AMBULATORY_CARE_PROVIDER_SITE_OTHER): Payer: PRIVATE HEALTH INSURANCE | Admitting: Pulmonary Disease

## 2011-07-31 ENCOUNTER — Encounter: Payer: Self-pay | Admitting: Pulmonary Disease

## 2011-07-31 VITALS — BP 110/58 | HR 78 | Temp 97.6°F | Ht 65.0 in | Wt 137.1 lb

## 2011-07-31 DIAGNOSIS — F17200 Nicotine dependence, unspecified, uncomplicated: Secondary | ICD-10-CM

## 2011-07-31 DIAGNOSIS — J449 Chronic obstructive pulmonary disease, unspecified: Secondary | ICD-10-CM

## 2011-07-31 DIAGNOSIS — Z720 Tobacco use: Secondary | ICD-10-CM

## 2011-07-31 NOTE — Progress Notes (Signed)
Subjective:    Patient ID: Larry Walters, male    DOB: 1950/12/07, 61 y.o.   MRN: [id number]  Synopsis: Larry Walters was first evaluated by the [facility] in the Spring of 2013 for COPD.  He has smoked 1/3 ppd for 10.5 years and continues to smoke.  GOLD Grade D.  He was hospitalized for a flare in June 2013 at [facility].  HPI  07/03/11 ROV -- Since the last visit Larry Walters was hospitalized at [facility] for a COPD flare.  He stayed in the hospital for four days and was treated with prednisone and antibiotics.  His breathing difficulty and wheezing have improved since then.  He continues to smoke.  He has been using his Advair twice per day.  He does not have significant chest pain, but notes some chest pressure with exertion.  He uses oxygen with exertion regularly.  He is awaiting pulmonary rehab on July 16th.  He has been having a lot of depression symptoms recently as well as anxiety.  He states that a lot of his depression is based in his chronic everyday dyspnea.  He is not ready to quit smoking.  07/31/11 ROV -- oxygen level up above 95% most of the time;  Likes roflumilast, it is helping quite a bit, cough improved, less congestion, shortness of breath improved,   Past Medical History  Diagnosis Date  . COPD (chronic obstructive pulmonary disease)   . Mold exposure   . Tobacco dependency   . Anxiety   . Hypercholesteremia   . COPD (chronic obstructive pulmonary disease)   . AAA (abdominal aortic aneurysm)     s/p endovascular repair by Dr. Wyn Quaker in 01/2011  . Hypertension      Review of Systems  HENT: Negative for congestion, rhinorrhea, sneezing and postnasal drip.   Respiratory: Positive for cough, shortness of breath and wheezing.   Cardiovascular: Negative for chest pain and leg swelling.       Objective:   Physical Exam   Filed Vitals:   07/31/11 1546  BP: 110/58  Pulse: 78  Temp: 97.6 F (36.4 C)  TempSrc: Oral  Height: 5\' 5"  (1.651 m)  Weight: 137  lb 1.9 oz (62.197 kg)  SpO2: 95%   Gen: chronically ill appearing, no acute distress HEENT: NCAT, PERRL, EOMi, OP clear, neck supple without masses PULM: Poor air movement bilatearlly, some crackles in bases CV: RRR, no mgr, no JVD AB: BS+, soft, nontender, no hsm Ext: warm, no edema, no clubbing, no cyanosis  04/2010 PFT from [facility]ologist: FEV1 34% predicted, DLCO 44% predicted     Assessment & Plan:   COPD (chronic obstructive pulmonary disease) Larry Walters says breathing is doing better since starting Roflumilast. He needs to continue on all his current medications. He would like to stop some of them but considering the severity of his disease I think we need to continue him on his current regimen. He really needs to quit smoking and we discussed this at length today. We'll refer him to pulmonary rehabilitation as he is interested in doing anything that can help improve his exercise tolerance and reduce the chances of an exacerbation of COPD.  Tobacco abuse Discussed at length. He has cut down to 2-3 cigarettes a day. He'll continue to work with a psychiatrist would help set him up to manage his depression and hopefully this will help him to be alert cut back on the cigarettes as well. He was again advised in clinic today that the  best thing he can do for his COPD is to stop smoking immediately.    Updated Medication List Outpatient Encounter Prescriptions as of 07/31/2011  Medication Sig Dispense Refill  . Albuterol Sulfate (PROVENTIL HFA IN) Inhale 2 puffs into the lungs every 6 (six) hours as needed.      Marland Kitchen alprazolam (XANAX) 2 MG tablet Take 2 mg by mouth 2 (two) times daily as needed.      Marland Kitchen aspirin 81 MG tablet Take 81 mg by mouth daily.      . Fluticasone-Salmeterol (ADVAIR DISKUS) 250-50 MCG/DOSE AEPB Inhale 1 puff into the lungs every 12 (twelve) hours.      Marland Kitchen lisinopril (PRINIVIL,ZESTRIL) 20 MG tablet Take 10 mg by mouth daily.      . nicotine (NICODERM CQ - DOSED IN  MG/24 HR) 7 mg/24hr patch Place 1 patch onto the skin daily.      . roflumilast (DALIRESP) 500 MCG TABS tablet Take 1 tablet (500 mcg total) by mouth daily.  30 tablet  2  . tiotropium (SPIRIVA) 18 MCG inhalation capsule Place 18 mcg into inhaler and inhale daily.      . vitamin B-12 (CYANOCOBALAMIN) 500 MCG tablet Take 500 mcg by mouth daily.      Marland Kitchen DISCONTD: predniSONE (DELTASONE) 10 MG tablet Take 10 mg by mouth daily.

## 2011-07-31 NOTE — Patient Instructions (Addendum)
Keep taking your medications as prescribed. See our phone number on this sheet, call us when you develop respiratory symptoms. Keep cutting back on the cigarettes. We will see you back in 2-3 months or sooner if needed.

## 2011-08-01 ENCOUNTER — Other Ambulatory Visit: Payer: Self-pay | Admitting: Cardiovascular Disease

## 2011-08-01 ENCOUNTER — Other Ambulatory Visit: Payer: Self-pay

## 2011-08-01 ENCOUNTER — Other Ambulatory Visit (INDEPENDENT_AMBULATORY_CARE_PROVIDER_SITE_OTHER): Payer: PRIVATE HEALTH INSURANCE

## 2011-08-01 DIAGNOSIS — R0600 Dyspnea, unspecified: Secondary | ICD-10-CM

## 2011-08-01 DIAGNOSIS — R079 Chest pain, unspecified: Secondary | ICD-10-CM

## 2011-08-01 DIAGNOSIS — R0989 Other specified symptoms and signs involving the circulatory and respiratory systems: Secondary | ICD-10-CM

## 2011-08-01 NOTE — Assessment & Plan Note (Addendum)
Jakai says breathing is doing better since starting Roflumilast. He needs to continue on all his current medications. He would like to stop some of them but considering the severity of his disease I think we need to continue him on his current regimen. He really needs to quit smoking and we discussed this at length today. We'll refer him to pulmonary rehabilitation as he is interested in doing anything that can help improve his exercise tolerance and reduce the chances of an exacerbation of COPD.

## 2011-08-01 NOTE — Assessment & Plan Note (Signed)
Discussed at length. He has cut down to 2-3 cigarettes a day. He'll continue to work with a psychiatrist would help set him up to manage his depression and hopefully this will help him to be alert cut back on the cigarettes as well. He was again advised in clinic today that the best thing he can do for his COPD is to stop smoking immediately.

## 2011-08-03 ENCOUNTER — Ambulatory Visit: Payer: PRIVATE HEALTH INSURANCE | Admitting: Cardiovascular Disease

## 2011-08-04 ENCOUNTER — Encounter: Payer: Self-pay | Admitting: Cardiovascular Disease

## 2011-08-04 ENCOUNTER — Ambulatory Visit (INDEPENDENT_AMBULATORY_CARE_PROVIDER_SITE_OTHER): Payer: PRIVATE HEALTH INSURANCE | Admitting: Cardiovascular Disease

## 2011-08-04 VITALS — BP 100/58 | HR 95 | Ht 65.0 in | Wt 136.5 lb

## 2011-08-04 DIAGNOSIS — I6529 Occlusion and stenosis of unspecified carotid artery: Secondary | ICD-10-CM

## 2011-08-04 DIAGNOSIS — R079 Chest pain, unspecified: Secondary | ICD-10-CM

## 2011-08-04 DIAGNOSIS — I739 Peripheral vascular disease, unspecified: Secondary | ICD-10-CM

## 2011-08-04 DIAGNOSIS — E785 Hyperlipidemia, unspecified: Secondary | ICD-10-CM

## 2011-08-04 MED ORDER — ATORVASTATIN CALCIUM 20 MG PO TABS: 20.0000 mg | ORAL_TABLET | Freq: Every day | ORAL | Status: DC

## 2011-08-04 NOTE — Patient Instructions (Addendum)
Your physician has requested that you have a carotid duplex. This test is an ultrasound of the carotid arteries in your neck. It looks at blood flow through these arteries that supply the brain with blood. Allow one hour for this exam. There are no restrictions or special instructions.  Resume taking Plavix.  Start Atorvastatin 20 mg once daily for high cholesterol.  Labs to be done in 6 weeks. (fasting).  Follow up in 3 months.

## 2011-08-06 ENCOUNTER — Encounter: Payer: Self-pay | Admitting: Cardiovascular Disease

## 2011-08-06 DIAGNOSIS — I6529 Occlusion and stenosis of unspecified carotid artery: Secondary | ICD-10-CM | POA: Insufficient documentation

## 2011-08-06 DIAGNOSIS — E785 Hyperlipidemia, unspecified: Secondary | ICD-10-CM | POA: Insufficient documentation

## 2011-08-06 NOTE — Progress Notes (Signed)
HPI  This is a 61 year old man who is here today for a followup visit. The patient has extensive medical problems. He has severe COPD. 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 He had endovascular repair done in January of 2013 by Dr. dew. He has seen multiple clinical providers. Before the change in his insurance, he saw Dr. Hyacinth Meeker in [place]. It appears that he had no cardiac testing done but he did have an ABI which was abnormal as expected based on his known history of PAD. He saw Dr. Juliann Pares for exertional dyspnea and tachycardia. He was given diltiazem that the patient was very hesitant to take that medication due to his perception that it would worsen his COPD. He has been on Plavix for peripheral arterial disease. Recently had significant nosebleed that required emergency room visit. It was cauterized and packed. He has taken himself off Plavix since then for that reason. Some of the bleeding was attributed to continuous use of dry oxygen.  It appears that he had no previous cardiac evaluation and thus I proceeded with a nuclear stress test. The patient could not complete the treadmill part of the stress test due to severe claudication mostly on the right side. He also had significant dyspnea without chest pain. He underwent a pharmacologic nuclear stress test which showed no evidence of ischemia with normal ejection fraction. There was possibility of a previous inferior apical infarct which was relatively small to moderate in size. He had an echocardiogram done which was overall unremarkable.  Allergies  Allergen Reactions  . Citalopram   . Mirtazapine   . Penicillins Hives  . Sertraline Hcl      Current Outpatient Prescriptions on File Prior to Visit  Medication Sig Dispense Refill  . Albuterol Sulfate (PROVENTIL HFA IN) Inhale 2 puffs into the lungs every 6 (six) hours as needed.      Marland Kitchen alprazolam (XANAX) 2 MG tablet Take 2 mg by mouth 2  (two) times daily as needed.      Marland Kitchen aspirin 81 MG tablet Take 81 mg by mouth daily.      . Fluticasone-Salmeterol (ADVAIR DISKUS) 250-50 MCG/DOSE AEPB Inhale 1 puff into the lungs every 12 (twelve) hours.      Marland Kitchen lisinopril (PRINIVIL,ZESTRIL) 20 MG tablet Take 20 mg by mouth daily.       . nicotine (NICODERM CQ - DOSED IN MG/24 HR) 7 mg/24hr patch Place 1 patch onto the skin daily.      Marland Kitchen tiotropium (SPIRIVA) 18 MCG inhalation capsule Place 18 mcg into inhaler and inhale daily.      . vitamin B-12 (CYANOCOBALAMIN) 500 MCG tablet Take 500 mcg by mouth daily.      Marland Kitchen atorvastatin (LIPITOR) 20 MG tablet Take 1 tablet (20 mg total) by mouth daily.  30 tablet  6     Past Medical History  Diagnosis Date  . COPD (chronic obstructive pulmonary disease)   . Mold exposure   . Tobacco dependency   . Anxiety   . Hypercholesteremia   . COPD (chronic obstructive pulmonary disease)   . AAA (abdominal aortic aneurysm)     s/p endovascular repair by Dr. Wyn Quaker in 01/2011  . Hypertension      Past Surgical History  Procedure Date  . Cataract extraction 12/20/2010    left eye  . Vein surgery 02/10/2011  . Abdominal aortic aneurysm repair      Family History  Problem Relation Age of  Onset  . COPD Mother   . Aneurysm Father      History   Social History  . Marital Status: Married    Spouse Name: N/A    Number of Children: 1  . Years of Education: N/A   Occupational History  . Maintenence work    Social History Main Topics  . Smoking status: Current Everyday Smoker -- 0.3 packs/day for 35 years    Types: Cigarettes  . Smokeless tobacco: Never Used  . Alcohol Use: No  . Drug Use: No  . Sexually Active: Not on file   Other Topics Concern  . Not on file   Social History Narrative  . No narrative on file     PHYSICAL EXAM   BP 100/58  Pulse 95  Ht 5\' 5"  (1.651 m)  Wt 136 lb 8 oz (61.916 kg)  BMI 22.71 kg/m2  Constitutional: He is oriented to person, place, and time. He  appears well-developed and well-nourished. No distress.  HENT: No nasal discharge.  Head: Normocephalic and atraumatic.  Eyes: Pupils are equal and round. Right eye exhibits no discharge. Left eye exhibits no discharge.  Neck: Normal range of motion. Neck supple. No JVD present. No thyromegaly present.  Cardiovascular: Normal rate, regular rhythm, normal heart sounds and. Exam reveals no gallop and no friction rub. No murmur heard.  Pulmonary/Chest: Effort normal and breath sounds normal. No stridor. No respiratory distress. He has no wheezes. He has no rales. He exhibits no tenderness.  Abdominal: Soft. Bowel sounds are normal. He exhibits no distension. There is no tenderness. There is no rebound and no guarding.  Musculoskeletal: Normal range of motion. He exhibits no edema and no tenderness.  Neurological: He is alert and oriented to person, place, and time. Coordination normal.  Skin: Skin is warm and dry. No rash noted. He is not diaphoretic. No erythema. No pallor.  Psychiatric: He has a normal mood and affect. His behavior is normal. Judgment and thought content normal.  Vascular: Femoral pulses are slightly diminished bilaterally with bilateral surgical scars. DP/PT:  not palpable bilaterally.       ASSESSMENT AND PLAN

## 2011-08-06 NOTE — Assessment & Plan Note (Signed)
Nuclear stress test showed no clear evidence of ischemia. There was a possibility of an inferior apical infarct. Wall motion was normal an echocardiogram. Most of his symptoms currently seems to be related to COPD. Thus, I recommend medical therapy for now. A small dose diltiazem can be considered in the future given that he complains of exertional tachycardia.

## 2011-08-06 NOTE — Assessment & Plan Note (Signed)
The patient has multiple indications for a statin due to extensive PAD, carotid disease and possible underlying CAD. Thus, I recommend starting atorvastatin 20 mg daily. I will obtain fasting lipid and liver profile in 6 weeks.

## 2011-08-06 NOTE — Assessment & Plan Note (Addendum)
The patient has known history of bilateral carotid stenosis with no recent evaluation. I will obtain carotid duplex ultrasound. I asked him to resume taking Plavix.

## 2011-08-06 NOTE — Assessment & Plan Note (Addendum)
The patient seems to have severe lifestyle limiting claudication mostly on the right side. 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 I discussed the case with Dr. Wyn Quaker who will followup with the patient regarding this. For now, I asked him to resume taking Plavix. I also strongly advised him to quit smoking.

## 2011-08-07 ENCOUNTER — Telehealth: Payer: Self-pay | Admitting: Cardiovascular Disease

## 2011-08-07 NOTE — Telephone Encounter (Signed)
Pt says he started atorvastatin as directed by Dr. Kirke Corin Friday.  He has taken this x 3 days and says he cannot tolerate. He says it makes him very "nervous" and "jittery".  I explained it usually does not cause these symptoms but pt is very aldomet about stopping med.  When asked if he resumed Plavix as instructed he says "no" since he is afraid it will cause another nosebleed problem. I encouraged him to resume since he has hx PAD/blockage.  He says he is afraid to. I encouraged him, again, to try and if it causes a nosebleed he should let us know.  I explained, if he continues to stay off plavix, he is putting himself at higher risk for stroke/thromboembolic events.  He says he is aware and states, "maybe that would be the best thing to happen to me".  Upon questioning pt further, he admits to being depressed and says his PCP has tried multiple antidepressants for him with no improvement.  He was then referred to a psychologist. Psychologist could not prescribe meds and was referred to physchiatrist.  He says he cannot find a psychiatrist in his network.  I told him I would try to help him find a psychiatrist.  He says he thinks "this is just how life is going to be".  I explained he deserves a better quality of life than this and there is hope.  He denies suicidal thoughts and would appreciate our help.  In the meantime, I will let Dr. Kirke Corin know pt cannot tolerate atorvastatin and he has not resumed plavix as instructed.  I will call pt back after we have found psychiatrist to refer him to.  Understanding verb.

## 2011-08-07 NOTE — Telephone Encounter (Signed)
Pt called stating that his new cholestrol medication is making him sweat and nervous and cant make a complete sentence or think. Only been taking for 3 days lipitor.

## 2011-08-07 NOTE — Telephone Encounter (Signed)
Addendum to below note: Pt says Dr. Kirke Corin was going to speak with Dr. Wyn Quaker about PAD symptoms.  I explained to pt, per Dr. Jari Sportsman note, he did speak with Dr. Wyn Quaker and Dr. Driscilla Grammes office was going to contact pt. Pt reports he has gotten no correspondence from their office. I told him I would pass this info along to Dr. Kirke Corin.

## 2011-08-07 NOTE — Telephone Encounter (Signed)
Ok, thanks. I spoke with Dr. Wyn Quaker already. Can stop Lipitor for now.

## 2011-08-07 NOTE — Telephone Encounter (Signed)
Please see note below.  Let me know if you have any questions. Thanks!

## 2011-08-08 ENCOUNTER — Encounter: Payer: Self-pay | Admitting: Pulmonary Disease

## 2011-08-09 NOTE — Telephone Encounter (Signed)
FYI.    Pt informed.  Understanding verb. He also mentions "5 chest pains this morning".  Upon questioning him further, he describes these as fleeting and only lasting a second at a time. He describes these as feeling as if  "pin is pricking me".  These pains are right sided.He denies diaphoresis, sob or dizziness associated with these pains. I reassured him these do not sound cardiac in nature but I encouraged him to pay close att'n to symptoms and call us ASAP should these change or get worse.   He verb. Understanding.

## 2011-08-09 NOTE — Telephone Encounter (Signed)
I spoke with [facility] services. Referral placed for physchiatry. They tell me they DO accept pt's insurance and their psychiatrist are in network.  The intake coordinator will send pt info to MD for review and they will call pt to schedule appt.  I called pt to tell him to be expecting their call.  Had to [facility]

## 2011-08-10 ENCOUNTER — Encounter (INDEPENDENT_AMBULATORY_CARE_PROVIDER_SITE_OTHER): Payer: PRIVATE HEALTH INSURANCE

## 2011-08-10 DIAGNOSIS — I6529 Occlusion and stenosis of unspecified carotid artery: Secondary | ICD-10-CM

## 2011-08-15 ENCOUNTER — Other Ambulatory Visit: Payer: Self-pay | Admitting: Cardiology

## 2011-08-15 DIAGNOSIS — I739 Peripheral vascular disease, unspecified: Secondary | ICD-10-CM

## 2011-08-17 ENCOUNTER — Telehealth: Payer: Self-pay | Admitting: Pulmonary Disease

## 2011-08-17 ENCOUNTER — Encounter (INDEPENDENT_AMBULATORY_CARE_PROVIDER_SITE_OTHER): Payer: PRIVATE HEALTH INSURANCE

## 2011-08-17 DIAGNOSIS — I739 Peripheral vascular disease, unspecified: Secondary | ICD-10-CM

## 2011-08-17 DIAGNOSIS — I714 Abdominal aortic aneurysm, without rupture: Secondary | ICD-10-CM

## 2011-08-17 MED ORDER — ALBUTEROL SULFATE HFA 108 (90 BASE) MCG/ACT IN AERS: 2.0000 | INHALATION_SPRAY | Freq: Four times a day (QID) | RESPIRATORY_TRACT | Status: DC | PRN

## 2011-08-17 NOTE — Telephone Encounter (Signed)
Spoke with pt to verify msg- he is requesting rescue inhaler refilled. Rx was sent to pharm and nothing further needed per pt.

## 2011-08-23 ENCOUNTER — Other Ambulatory Visit: Payer: Self-pay | Admitting: Cardiology

## 2011-08-23 DIAGNOSIS — I739 Peripheral vascular disease, unspecified: Secondary | ICD-10-CM

## 2011-08-24 ENCOUNTER — Encounter (INDEPENDENT_AMBULATORY_CARE_PROVIDER_SITE_OTHER): Payer: PRIVATE HEALTH INSURANCE

## 2011-08-24 ENCOUNTER — Other Ambulatory Visit: Payer: Self-pay | Admitting: Cardiology

## 2011-08-24 ENCOUNTER — Encounter: Payer: Self-pay | Admitting: Pulmonary Disease

## 2011-08-24 DIAGNOSIS — I739 Peripheral vascular disease, unspecified: Secondary | ICD-10-CM

## 2011-08-24 DIAGNOSIS — I2510 Atherosclerotic heart disease of native coronary artery without angina pectoris: Secondary | ICD-10-CM | POA: Insufficient documentation

## 2011-08-24 HISTORY — DX: Atherosclerotic heart disease of native coronary artery without angina pectoris: I25.10

## 2011-08-30 ENCOUNTER — Encounter: Payer: Self-pay | Admitting: Pulmonary Disease

## 2011-09-06 ENCOUNTER — Telehealth: Payer: Self-pay | Admitting: Pulmonary Disease

## 2011-09-06 MED ORDER — PREDNISONE 10 MG PO TABS: ORAL_TABLET | ORAL | Status: DC

## 2011-09-06 NOTE — Telephone Encounter (Signed)
Ok to call in prednisone 10mg  tabs Take 4 each day for 2 days, then 3 each day for 2 days, then 2 each day for 2 days, then one each day for 2 days, then stop.  Make sure he is working on smoking cessation.

## 2011-09-06 NOTE — Telephone Encounter (Signed)
I spoke with pt and is aware of KC recs. He is aware on how to take the prednisone. I have sent rx to the pharmacy. Also pt states he is trying to work on stopping smoking. Will forward to Dr. Kendrick Fries as an Lorain Childes.

## 2011-09-06 NOTE — Telephone Encounter (Signed)
Noted, thanks!

## 2011-09-06 NOTE — Telephone Encounter (Signed)
I spoke with pt and he c/o some increase SOB, wheezing, chest tx, and chest congestion x this AM. Denies any cough, f/c/s/n/v. He stated he went to lung works this AM over at [facility] and the nurse listened to him and advised him that they could hear "wheezes and congestion" in his chest. He saw his pcp last week bc he was coughing and was giving doxycyline 100 mg BID. He has 9 tablets of this left. He is requesting to have prednisone called in for him. Pt did not want to come in for OV and if he did only wanted to be seen in [place] (which dr. Kendrick Fries is not out there until 09/18/11). Since Dr. Kendrick Fries is on night float will forward to doc of the day for recs. Please advise Dr. Shelle Iron thanks  Allergies  Allergen Reactions  . Citalopram   . Mirtazapine   . Penicillins Hives  . Sertraline Hcl

## 2011-09-07 ENCOUNTER — Ambulatory Visit (INDEPENDENT_AMBULATORY_CARE_PROVIDER_SITE_OTHER): Payer: PRIVATE HEALTH INSURANCE | Admitting: Cardiovascular Disease

## 2011-09-07 ENCOUNTER — Encounter: Payer: Self-pay | Admitting: Cardiovascular Disease

## 2011-09-07 ENCOUNTER — Encounter: Payer: Self-pay | Admitting: *Deleted

## 2011-09-07 VITALS — BP 132/64 | HR 94 | Ht 65.0 in | Wt 136.5 lb

## 2011-09-07 DIAGNOSIS — R079 Chest pain, unspecified: Secondary | ICD-10-CM

## 2011-09-07 DIAGNOSIS — I6529 Occlusion and stenosis of unspecified carotid artery: Secondary | ICD-10-CM

## 2011-09-07 DIAGNOSIS — I739 Peripheral vascular disease, unspecified: Secondary | ICD-10-CM

## 2011-09-07 MED ORDER — DILTIAZEM HCL ER COATED BEADS 120 MG PO CP24: 120.0000 mg | ORAL_CAPSULE | Freq: Every day | ORAL | Status: DC

## 2011-09-07 NOTE — Patient Instructions (Addendum)
Stop taking your Lisinopril Start taking Diltiazem ER 120mg  daily  Your physician has requested that you have a cardiac catheterization. Cardiac catheterization is used to diagnose and/or treat various heart conditions. Doctors may recommend this procedure for a number of different reasons. The most common reason is to evaluate chest pain. Chest pain can be a symptom of coronary artery disease (CAD), and cardiac catheterization can show whether plaque is narrowing or blocking your heart's arteries. This procedure is also used to evaluate the valves, as well as measure the blood flow and oxygen levels in different parts of your heart. For further information please visit [email or web address] Please follow instruction sheet, as given.

## 2011-09-08 ENCOUNTER — Encounter (HOSPITAL_COMMUNITY): Payer: Self-pay | Admitting: Pharmacy Technician

## 2011-09-08 LAB — PROTIME-INR
INR: 1 (ref 0.8–1.2)
Prothrombin Time: 10.7 s (ref 9.1–12.0)

## 2011-09-08 LAB — CBC WITH DIFFERENTIAL/PLATELET
Basophils Absolute: 0 10*3/uL (ref 0.0–0.2)
Hemoglobin: 14.3 g/dL (ref 12.6–17.7)
Immature Grans (Abs): 0 10*3/uL (ref 0.0–0.1)
Lymphs: 6 % — ABNORMAL LOW (ref 14–46)
MCHC: 33.4 g/dL (ref 31.5–35.7)
Monocytes: 6 % (ref 4–13)
Neutrophils Absolute: 15.3 10*3/uL — ABNORMAL HIGH (ref 1.8–7.8)
Neutrophils Relative %: 88 % — ABNORMAL HIGH (ref 40–74)
RBC: 4.68 x10E6/uL (ref 4.14–5.80)

## 2011-09-08 LAB — BASIC METABOLIC PANEL
BUN: 9 mg/dL (ref 6–24)
CO2: 19 mmol/L (ref 19–28)
Calcium: 9 mg/dL (ref 8.7–10.2)
Chloride: 104 mmol/L (ref 97–108)
GFR calc Af Amer: 116 mL/min/{1.73_m2} (ref >59)
Glucose: 211 mg/dL — ABNORMAL HIGH (ref 65–99)

## 2011-09-08 NOTE — Progress Notes (Addendum)
  HPI  This is a 60-year-old man who is here today for a followup visit. The patient has extensive medical problems. He has severe COPD. 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 He had endovascular repair done in January of 2013 by Dr. dew. He has been on Plavix for peripheral arterial disease. Recently had significant nosebleed that required emergency room visit. It was cauterized and packed. He has taken himself off Plavix since then for that reason. Some of the bleeding was attributed to continuous use of dry oxygen.  The patient complained of severe exertional dyspnea with chest discomfort and tachycardia with a heart rate going up to around 120 beats per minute. He underwent evaluation with a nuclear stress test. The patient could not complete the treadmill part of the stress test due to severe claudication mostly on the right side. He also had significant dyspnea without chest pain. He underwent a pharmacologic nuclear stress test which showed no evidence of ischemia with normal ejection fraction. There was possibility of a previous inferior apical infarct which was relatively small to moderate in size. He had an echocardiogram done which was overall unremarkable. The patient continues to complain of dyspnea thought to be due to his COPD but this has been associated with chest discomfort and palpitations. He had worsening COPD recently and he is trying to quit smoking. He also continues to complain of right hip discomfort with minimal walking. I have personally spoken with Dr. Dew about having the patient for followup to address his claudication. The patient mentioned that he could not get an appointment and he requested to get his vascular here.  He was recently given doxycycline for bronchitis and was placed on steroids yesterday for COPD exacerbation. His wheezing has improved.   Allergies  Allergen Reactions  . Citalopram   . Mirtazapine   .  Penicillins Hives  . Plavix (Clopidogrel Bisulfate)     Felt drunk  . Sertraline Hcl      Current Outpatient Prescriptions on File Prior to Visit  Medication Sig Dispense Refill  . Albuterol Sulfate (PROVENTIL HFA IN) Inhale 2 puffs into the lungs every 6 (six) hours as needed.      . alprazolam (XANAX) 2 MG tablet Take 2 mg by mouth 2 (two) times daily as needed.      . aspirin 81 MG tablet Take 81 mg by mouth daily.      . atorvastatin (LIPITOR) 20 MG tablet Take 1 tablet (20 mg total) by mouth daily.  30 tablet  6  . Fluticasone-Salmeterol (ADVAIR DISKUS) 250-50 MCG/DOSE AEPB Inhale 1 puff into the lungs every 12 (twelve) hours.      . nicotine (NICODERM CQ - DOSED IN MG/24 HR) 7 mg/24hr patch Place 1 patch onto the skin daily.      . predniSONE (DELTASONE) 10 MG tablet Take 4 tablets x 2 days, 3 tablets x 2 days, 2 tablets x 2 days, 1 tablet x 2 days then stop  20 tablet  0  . tiotropium (SPIRIVA) 18 MCG inhalation capsule Place 18 mcg into inhaler and inhale daily.      . vitamin B-12 (CYANOCOBALAMIN) 500 MCG tablet Take 500 mcg by mouth daily.      . diltiazem (CARDIZEM CD) 120 MG 24 hr capsule Take 1 capsule (120 mg total) by mouth daily.  30 capsule  6     Past Medical History  Diagnosis Date  . COPD (chronic obstructive pulmonary   disease)   . Mold exposure   . Tobacco dependency   . Anxiety   . Hypercholesteremia   . COPD (chronic obstructive pulmonary disease)   . AAA (abdominal aortic aneurysm)     s/p endovascular repair by Dr. Dew in 01/2011  . Hypertension      Past Surgical History  Procedure Date  . Cataract extraction 12/20/2010    left eye  . Vein surgery 02/10/2011  . Abdominal aortic aneurysm repair      Family History  Problem Relation Age of Onset  . COPD Mother   . Aneurysm Father      History   Social History  . Marital Status: Married    Spouse Name: N/A    Number of Children: 1  . Years of Education: N/A   Occupational History  .  Maintenence work    Social History Main Topics  . Smoking status: Current Everyday Smoker -- 0.3 packs/day for 35 years    Types: Cigarettes  . Smokeless tobacco: Never Used  . Alcohol Use: No  . Drug Use: No  . Sexually Active: Not on file   Other Topics Concern  . Not on file   Social History Narrative  . No narrative on file      PHYSICAL EXAM   BP 132/64  Pulse 94  Ht 5' 5" (1.651 m)  Wt 136 lb 8 oz (61.916 kg)  BMI 22.71 kg/m2 Constitutional: He is oriented to person, place, and time. He appears ill and anxious. No distress.  HENT: No nasal discharge.  Head: Normocephalic and atraumatic.  Eyes: Pupils are equal and round. Right eye exhibits no discharge. Left eye exhibits no discharge.  Neck: Normal range of motion. Neck supple. No JVD present. No thyromegaly present.  Cardiovascular: Normal rate, regular rhythm, normal heart sounds. Exam reveals no gallop and no friction rub. No murmur heard.  Pulmonary/Chest: Effort normal and diminished breath sounds . No stridor. No respiratory distress. He has no wheezes. He has no rales. He exhibits no tenderness.  Abdominal: Soft. Bowel sounds are normal. He exhibits no distension. There is no tenderness. There is no rebound and no guarding.  Musculoskeletal: Normal range of motion. He exhibits no edema and no tenderness.  Neurological: He is alert and oriented to person, place, and time. Coordination normal.  Skin: Skin is warm and dry. No rash noted. He is not diaphoretic. No erythema. No pallor.  Psychiatric: He has a normal mood and affect. His behavior is normal. Judgment and thought content normal.  Vascular: There is a surgical scar in the right groin from previous endovascular repair of an aortic aneurysm. Femoral pulse: Very faint on the right side and +1 on the left side. Distal pulses are not palpable.     EKG: Sinus  Rhythm  -Right bundle branch block and right axis -possible right ventricular hypertrophy   -consider pulmonary disease.    ASSESSMENT AND PLAN   

## 2011-09-08 NOTE — Assessment & Plan Note (Addendum)
The patient continues to complain of severe lifestyle limiting claudication in his right hip. Aortoiliac duplex showed patent aortic stent. There was moderate to severe distal right common iliac stenosis. ABI was mildly reduced on the right side at 0.88. There was no evidence of infrainguinal disease on lower extremity arterial Doppler. Due to his severe symptoms, I recommend proceeding with abdominal aortogram and lower extremity runoff which will be performed at the same time of cardiac catheterization. I will attempt to access the right femoral artery in anticipation of treating his right iliac stenosis.

## 2011-09-08 NOTE — Assessment & Plan Note (Signed)
The patient continues to have episodes of chest tightness associated with tachycardia which could be due to supply demand ischemia related to severe COPD. Nuclear stress test  Suggested prior inferior infarct. Due to his continued symptoms, I recommend proceeding with cardiac catheterization and possible coronary intervention. Risks, benefits and alternatives were discussed with the patient. In the meantime, I will start him on diltiazem extended release 120 mg once daily to replace lisinopril to try to suppress his sinus tachycardia.

## 2011-09-08 NOTE — Assessment & Plan Note (Signed)
Moderate right carotid stenosis which will be monitored. Continue medical therapy.     

## 2011-09-11 ENCOUNTER — Telehealth: Payer: Self-pay

## 2011-09-11 ENCOUNTER — Ambulatory Visit: Payer: PRIVATE HEALTH INSURANCE | Admitting: Cardiovascular Disease

## 2011-09-11 NOTE — Telephone Encounter (Signed)
I called pt to give him pre cath lab results. He verb. Understanding but asks if Dr. Kirke Corin will prescribe him an anti anxiety med to take night before/morning of cath scheduled for Wednesday.He takes xanax but says "this isnt strong wnough".  He tells me, "If I do not get this med, I will not be at cath on Wednesday". I told him I would check with Dr. Kirke Corin and call him back. Understanding verb.

## 2011-09-12 ENCOUNTER — Telehealth: Payer: Self-pay | Admitting: Cardiovascular Disease

## 2011-09-12 ENCOUNTER — Ambulatory Visit: Payer: PRIVATE HEALTH INSURANCE | Admitting: Cardiovascular Disease

## 2011-09-12 NOTE — Telephone Encounter (Signed)
He should double the dose of Xanax. There is no other stronger medication. I will give him something to help him relax once he arrives the hospital.

## 2011-09-12 NOTE — Telephone Encounter (Signed)
Pt was notified and agrees. 

## 2011-09-12 NOTE — Telephone Encounter (Signed)
Patient would like for RN to call him back regarding a medication to take prior to his cath tomorrow?

## 2011-09-13 ENCOUNTER — Ambulatory Visit (HOSPITAL_COMMUNITY): Payer: PRIVATE HEALTH INSURANCE | Admitting: Psychiatry

## 2011-09-13 ENCOUNTER — Telehealth: Payer: Self-pay

## 2011-09-13 ENCOUNTER — Other Ambulatory Visit: Payer: Self-pay | Admitting: Cardiovascular Disease

## 2011-09-13 ENCOUNTER — Encounter (HOSPITAL_COMMUNITY)
Admission: RE | Disposition: A | Discharge status: Home / Self Care | Payer: Self-pay | Source: Ambulatory Visit | Attending: Cardiovascular Disease

## 2011-09-13 ENCOUNTER — Ambulatory Visit (HOSPITAL_COMMUNITY)
Admission: RE | Admit: 2011-09-13 | Discharge: 2011-09-13 | Disposition: A | Discharge status: Home / Self Care | Payer: No Typology Code available for payment source | Source: Ambulatory Visit | Attending: Cardiovascular Disease | Admitting: Cardiovascular Disease

## 2011-09-13 DIAGNOSIS — E7800 Pure hypercholesterolemia, unspecified: Secondary | ICD-10-CM | POA: Insufficient documentation

## 2011-09-13 DIAGNOSIS — J4489 Other specified chronic obstructive pulmonary disease: Secondary | ICD-10-CM | POA: Insufficient documentation

## 2011-09-13 DIAGNOSIS — I10 Essential (primary) hypertension: Secondary | ICD-10-CM | POA: Insufficient documentation

## 2011-09-13 DIAGNOSIS — J449 Chronic obstructive pulmonary disease, unspecified: Secondary | ICD-10-CM | POA: Insufficient documentation

## 2011-09-13 DIAGNOSIS — I2510 Atherosclerotic heart disease of native coronary artery without angina pectoris: Secondary | ICD-10-CM

## 2011-09-13 DIAGNOSIS — I70219 Atherosclerosis of native arteries of extremities with intermittent claudication, unspecified extremity: Secondary | ICD-10-CM

## 2011-09-13 DIAGNOSIS — I714 Abdominal aortic aneurysm, without rupture, unspecified: Secondary | ICD-10-CM | POA: Insufficient documentation

## 2011-09-13 DIAGNOSIS — I2582 Chronic total occlusion of coronary artery: Secondary | ICD-10-CM | POA: Insufficient documentation

## 2011-09-13 DIAGNOSIS — F411 Generalized anxiety disorder: Secondary | ICD-10-CM | POA: Insufficient documentation

## 2011-09-13 DIAGNOSIS — F17200 Nicotine dependence, unspecified, uncomplicated: Secondary | ICD-10-CM | POA: Insufficient documentation

## 2011-09-13 DIAGNOSIS — I739 Peripheral vascular disease, unspecified: Secondary | ICD-10-CM

## 2011-09-13 HISTORY — PX: CARDIAC CATHETERIZATION: SHX172

## 2011-09-13 HISTORY — PX: LEFT HEART CATHETERIZATION WITH CORONARY ANGIOGRAM: SHX5451

## 2011-09-13 HISTORY — PX: ABDOMINAL AORTAGRAM: SHX5454

## 2011-09-13 LAB — POCT ACTIVATED CLOTTING TIME
Activated Clotting Time: 170 seconds
Activated Clotting Time: 195 seconds

## 2011-09-13 LAB — CBC
Platelets: 195 10*3/uL (ref 150–400)
RDW: 13.4 % (ref 11.5–15.5)
WBC: 12.3 10*3/uL — ABNORMAL HIGH (ref 4.0–10.5)

## 2011-09-13 SURGERY — LEFT HEART CATHETERIZATION WITH CORONARY ANGIOGRAM
Anesthesia: LOCAL

## 2011-09-13 MED ORDER — MORPHINE SULFATE 4 MG/ML IJ SOLN: 2.0000 mg | INTRAMUSCULAR | Status: DC | PRN

## 2011-09-13 MED ORDER — ASPIRIN 81 MG PO CHEW
324.0000 mg | CHEWABLE_TABLET | ORAL | Status: AC
  Administered 2011-09-13: 324 mg via ORAL
  Filled 2011-09-13: qty 4

## 2011-09-13 MED ORDER — CLOPIDOGREL BISULFATE 300 MG PO TABS
ORAL_TABLET | ORAL | Status: AC
  Filled 2011-09-13: qty 1

## 2011-09-13 MED ORDER — DIAZEPAM 5 MG PO TABS
10.0000 mg | ORAL_TABLET | ORAL | Status: AC
  Administered 2011-09-13: 10 mg via ORAL
  Filled 2011-09-13: qty 2

## 2011-09-13 MED ORDER — SODIUM CHLORIDE 0.9 % IV SOLN: INTRAVENOUS | Status: DC

## 2011-09-13 MED ORDER — MIDAZOLAM HCL 2 MG/2ML IJ SOLN
INTRAMUSCULAR | Status: AC
  Filled 2011-09-13: qty 2

## 2011-09-13 MED ORDER — CLOPIDOGREL BISULFATE 75 MG PO TABS: 75.0000 mg | ORAL_TABLET | Freq: Every day | ORAL | Status: DC

## 2011-09-13 MED ORDER — SODIUM CHLORIDE 0.9 % IJ SOLN: 3.0000 mL | Freq: Two times a day (BID) | INTRAMUSCULAR | Status: DC

## 2011-09-13 MED ORDER — FENTANYL CITRATE 0.05 MG/ML IJ SOLN
INTRAMUSCULAR | Status: AC
  Filled 2011-09-13: qty 2

## 2011-09-13 MED ORDER — CLOPIDOGREL BISULFATE 75 MG PO TABS
300.0000 mg | ORAL_TABLET | Freq: Once | ORAL | Status: AC
  Administered 2011-09-13: 300 mg via ORAL

## 2011-09-13 MED ORDER — HEPARIN (PORCINE) IN NACL 2-0.9 UNIT/ML-% IJ SOLN
INTRAMUSCULAR | Status: AC
  Filled 2011-09-13: qty 2000

## 2011-09-13 MED ORDER — SODIUM CHLORIDE 0.9 % IV SOLN
INTRAVENOUS | Status: DC
  Administered 2011-09-13: 1000 mL via INTRAVENOUS

## 2011-09-13 MED ORDER — ONDANSETRON HCL 4 MG/2ML IJ SOLN: 4.0000 mg | Freq: Four times a day (QID) | INTRAMUSCULAR | Status: DC | PRN

## 2011-09-13 MED ORDER — SODIUM CHLORIDE 0.9 % IJ SOLN: 3.0000 mL | INTRAMUSCULAR | Status: DC | PRN

## 2011-09-13 MED ORDER — ACETAMINOPHEN 325 MG PO TABS: 650.0000 mg | ORAL_TABLET | ORAL | Status: DC | PRN

## 2011-09-13 MED ORDER — HEPARIN SODIUM (PORCINE) 1000 UNIT/ML IJ SOLN
INTRAMUSCULAR | Status: AC
  Filled 2011-09-13: qty 1

## 2011-09-13 MED ORDER — SODIUM CHLORIDE 0.9 % IV SOLN: 250.0000 mL | INTRAVENOUS | Status: DC | PRN

## 2011-09-13 NOTE — CV Procedure (Signed)
Cardiac Catheterization + Vascular  Procedure Note  Name: Larry Walters MRN: [id number] DOB: [birth year]  Procedure: Left Heart Cath, Selective Coronary Angiography, abdominal aortography with lower extremity runoff, self-expanding stent placement to the right common iliac artery.  Indication: Chest pain with abnormal stress test. PAD with lifestyle limiting claudication.   Medications:  Sedation:  3 mg IV Versed, 50 mcg IV Fentanyl  Contrast:  194 mL  Procedural details: The right groin was prepped, draped, and anesthetized with 1% lidocaine. Using modified Seldinger technique, a 5 French sheath was introduced into the right femoral artery. Standard Judkins catheters were used for coronary angiography . Catheter exchanges were performed over a guidewire. There were no immediate procedural complications.    Procedural Findings:  Hemodynamics: AO:  125/67 mmHg LV:  121/5    mmHg LVEDP: 14  mmHg  Coronary angiography: Coronary dominance: Right   Left Main:  Normal in size with mild calcifications. It is free of significant disease.  Left Anterior Descending (LAD):  Normal in size with mild calcifications. There is a 50% tubular stenosis proximally. The rest of the vessel has minor irregularities.  1st diagonal (D1):  Small in size.  2nd diagonal (D2):  Medium in size with minor irregularities.  3rd diagonal (D3):  Normal in size and free of significant disease.  Circumflex (LCx):  Normal in size and nondominant. The vessel has minor irregularities.  1st obtuse marginal:  Very small in size.  2nd obtuse marginal:  Normal in size and free of significant disease.  3rd obtuse marginal:  Normal in size and free of significant disease.   Ramus Intermedius:  Medium in size with minor irregularities.  Right Coronary Artery: The vessel is normal in size and dominant. It is occluded proximally with extensive collaterals coming from the left circumflex and LAD.  Left  ventriculography: Was not performed. EF is normal by noninvasive testing.   Final Conclusions:  1. 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 Moderate proximal LAD stenosis. 2. Mildly elevated left ventricular end-diastolic pressure. Normal ejection fraction by echo.  Recommendations:  Recommend continuing medical therapy and intensifying anti-angina treatment.   Peripheral vascular procedure:  Procedural details: The pigtail catheter was withdrawn from the left ventricle into the abdominal aorta above the renal arteries. A power injection of 27ml/sec contrast over 1 sec was performed for Abdominal Aortic Angiography.  The catheter was then pulled back to a level just above the Aortic bifurcation, and a second power injection was performed to evaluate bi-ileiofemoral arteries with runnoff.  Interventional Procedure:  The 5 French sheath was exchanged into a 6 [place] Bright tip sheath. 4000 units of unfractionated heparin was given with therapeutic ACT. The lesion and the distal right common iliac artery was stented with a 9 x 60 mm self-expanding stent. This overlap with the previously placed stent in the common iliac artery. This was post dilated with a 7 x 40 mm balloon to 10 atmosphere and 8 atmospheres distally. Angiography showed excellent results. The wire and balloon were removed. The sheath was kept in place to be removed manually.   Findings:  Abdominal aorta: A stent graft is noted at the level of the renal arteries extending into both iliac arteries. No significant stenosis or leaks are noted around the stent.  Left renal artery: Normal in size and free of significant disease.  Right renal artery: 2 renal arteries on the right side with minor irregularities.  Celiac artery: Not well visualized.  Superior mesenteric artery: Patent.  Right common iliac artery: No significant disease proximally. However, distal to the  previously placed stent, there is an 80% stenosis at the internal iliac artery.  Right internal iliac artery: There is 50% ostial stenosis.  Right external iliac artery: 50% proximal disease with minor irregularities distally.  Right common femoral artery: Minor irregularities.  Right profunda femoral artery: Normal.  Right superficial femoral artery: Normal.  Right popliteal artery: Normal.  Right tibial peroneal trunk: Normal.  Right anterior tibial artery: Minor irregularities.  Right peroneal artery: Normal.  Right posterior tibial artery: Normal.  Left common iliac artery:  Patent stent proximally. In the distal segment, there is a 50% disease extending to the proximal external iliac artery.  Left internal iliac artery: Mild 20% ostial disease.  Left external iliac artery: Minor irregularities.  Left common femoral artery: Minor irregularities.  Left profunda femoral artery: Normal.  Left superficial femoral artery:  Normal.  Left popliteal artery: Normal.  Left tibial peroneal trunk: Normal.  Left anterior tibial artery: Normal.  Left peroneal artery: Normal.  Left posterior tibial artery: Normal.  Conclusions: 1. Patent aortoiliac stent graft without significant leaks or stenosis. 2. Severe right common iliac artery stenosis distal to the previously placed stent. Moderate left common iliac artery stenosis.  3. No significant infrainguinal disease. 4. Successful self-expanding stent placement to the distal right common iliac artery.  Recommendations:  Smoking cessation and Plavix for one month.   [name], MD, [facility] 09/13/2011 12:15 PM    [name] MD, [facility] 09/13/2011, 12:09 PM

## 2011-09-13 NOTE — Telephone Encounter (Signed)
Message copied by Marcelle Overlie on Wed Sep 13, 2011  8:09 AM ------      Message from: [name] A      Created: Tue Sep 12, 2011  2:11 PM      Regarding: RE: Lorain Childes       No need to repeat. It is high because on is on steroids.       ----- Message -----         From: Marcelle Overlie, RN         Sent: 09/11/2011   4:31 PM           To: Iran Ouch, MD      Subject: fyi                                                      Please note most recent WBC's, although I see where pt was recently started on abx for bronchitis, so this is probably an old finding.  Does this need to be repeated day of procedure 8/21?

## 2011-09-13 NOTE — Interval H&P Note (Signed)
History and Physical Interval Note:  09/13/2011 10:39 AM  Larry Walters  has presented today for surgery, with the diagnosis of claudication/cp/abnormal stress test  The various methods of treatment have been discussed with the patient and family. After consideration of risks, benefits and other options for treatment, the patient has consented to  Procedure(s) (LRB): LEFT HEART CATHETERIZATION WITH CORONARY ANGIOGRAM (N/A) ABDOMINAL AORTAGRAM (N/A) as a surgical intervention .  The patient's history has been reviewed, patient examined, no change in status, stable for surgery.  I have reviewed the patient's chart and labs.  Questions were answered to the patient's satisfaction.     [name]

## 2011-09-13 NOTE — H&P (View-Only) (Signed)
HPI  This is a 61 year old man who is here today for a followup visit. The patient has extensive medical problems. He has severe COPD. 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 He had endovascular repair done in January of 2013 by Dr. dew. He has been on Plavix for peripheral arterial disease. Recently had significant nosebleed that required emergency room visit. It was cauterized and packed. He has taken himself off Plavix since then for that reason. Some of the bleeding was attributed to continuous use of dry oxygen.  The patient complained of severe exertional dyspnea with chest discomfort and tachycardia with a heart rate going up to around 120 beats per minute. He underwent evaluation with a nuclear stress test. The patient could not complete the treadmill part of the stress test due to severe claudication mostly on the right side. He also had significant dyspnea without chest pain. He underwent a pharmacologic nuclear stress test which showed no evidence of ischemia with normal ejection fraction. There was possibility of a previous inferior apical infarct which was relatively small to moderate in size. He had an echocardiogram done which was overall unremarkable. The patient continues to complain of dyspnea thought to be due to his COPD but this has been associated with chest discomfort and palpitations. He had worsening COPD recently and he is trying to quit smoking. He also continues to complain of right hip discomfort with minimal walking. I have personally spoken with Dr. Wyn Quaker about having the patient for followup to address his claudication. The patient mentioned that he could not get an appointment and he requested to get his vascular here.  He was recently given doxycycline for bronchitis and was placed on steroids yesterday for COPD exacerbation. His wheezing has improved.   Allergies  Allergen Reactions  . Citalopram   . Mirtazapine   .  Penicillins Hives  . Plavix (Clopidogrel Bisulfate)     Felt drunk  . Sertraline Hcl      Current Outpatient Prescriptions on File Prior to Visit  Medication Sig Dispense Refill  . Albuterol Sulfate (PROVENTIL HFA IN) Inhale 2 puffs into the lungs every 6 (six) hours as needed.      Marland Kitchen alprazolam (XANAX) 2 MG tablet Take 2 mg by mouth 2 (two) times daily as needed.      Marland Kitchen aspirin 81 MG tablet Take 81 mg by mouth daily.      Marland Kitchen atorvastatin (LIPITOR) 20 MG tablet Take 1 tablet (20 mg total) by mouth daily.  30 tablet  6  . Fluticasone-Salmeterol (ADVAIR DISKUS) 250-50 MCG/DOSE AEPB Inhale 1 puff into the lungs every 12 (twelve) hours.      . nicotine (NICODERM CQ - DOSED IN MG/24 HR) 7 mg/24hr patch Place 1 patch onto the skin daily.      . predniSONE (DELTASONE) 10 MG tablet Take 4 tablets x 2 days, 3 tablets x 2 days, 2 tablets x 2 days, 1 tablet x 2 days then stop  20 tablet  0  . tiotropium (SPIRIVA) 18 MCG inhalation capsule Place 18 mcg into inhaler and inhale daily.      . vitamin B-12 (CYANOCOBALAMIN) 500 MCG tablet Take 500 mcg by mouth daily.      Marland Kitchen diltiazem (CARDIZEM CD) 120 MG 24 hr capsule Take 1 capsule (120 mg total) by mouth daily.  30 capsule  6     Past Medical History  Diagnosis Date  . COPD (chronic obstructive pulmonary  disease)   . Mold exposure   . Tobacco dependency   . Anxiety   . Hypercholesteremia   . COPD (chronic obstructive pulmonary disease)   . AAA (abdominal aortic aneurysm)     s/p endovascular repair by Dr. Wyn Quaker in 01/2011  . Hypertension      Past Surgical History  Procedure Date  . Cataract extraction 12/20/2010    left eye  . Vein surgery 02/10/2011  . Abdominal aortic aneurysm repair      Family History  Problem Relation Age of Onset  . COPD Mother   . Aneurysm Father      History   Social History  . Marital Status: Married    Spouse Name: N/A    Number of Children: 1  . Years of Education: N/A   Occupational History  .  Maintenence work    Social History Main Topics  . Smoking status: Current Everyday Smoker -- 0.3 packs/day for 35 years    Types: Cigarettes  . Smokeless tobacco: Never Used  . Alcohol Use: No  . Drug Use: No  . Sexually Active: Not on file   Other Topics Concern  . Not on file   Social History Narrative  . No narrative on file      PHYSICAL EXAM   BP 132/64  Pulse 94  Ht 5\' 5"  (1.651 m)  Wt 136 lb 8 oz (61.916 kg)  BMI 22.71 kg/m2 Constitutional: He is oriented to person, place, and time. He appears ill and anxious. No distress.  HENT: No nasal discharge.  Head: Normocephalic and atraumatic.  Eyes: Pupils are equal and round. Right eye exhibits no discharge. Left eye exhibits no discharge.  Neck: Normal range of motion. Neck supple. No JVD present. No thyromegaly present.  Cardiovascular: Normal rate, regular rhythm, normal heart sounds. Exam reveals no gallop and no friction rub. No murmur heard.  Pulmonary/Chest: Effort normal and diminished breath sounds . No stridor. No respiratory distress. He has no wheezes. He has no rales. He exhibits no tenderness.  Abdominal: Soft. Bowel sounds are normal. He exhibits no distension. There is no tenderness. There is no rebound and no guarding.  Musculoskeletal: Normal range of motion. He exhibits no edema and no tenderness.  Neurological: He is alert and oriented to person, place, and time. Coordination normal.  Skin: Skin is warm and dry. No rash noted. He is not diaphoretic. No erythema. No pallor.  Psychiatric: He has a normal mood and affect. His behavior is normal. Judgment and thought content normal.  Vascular: There is a surgical scar in the right groin from previous endovascular repair of an aortic aneurysm. Femoral pulse: Very faint on the right side and +1 on the left side. Distal pulses are not palpable.     EKG: Sinus  Rhythm  -Right bundle branch block and right axis -possible right ventricular hypertrophy   -consider pulmonary disease.    ASSESSMENT AND PLAN

## 2011-09-15 ENCOUNTER — Other Ambulatory Visit: Payer: PRIVATE HEALTH INSURANCE

## 2011-09-18 ENCOUNTER — Telehealth: Payer: Self-pay | Admitting: Cardiovascular Disease

## 2011-09-18 NOTE — Telephone Encounter (Signed)
Pt informed Understanding verb 

## 2011-09-18 NOTE — Telephone Encounter (Signed)
Discussed with Dr. Kirke Corin who says "pt should continue meds as prescribed, especially Plavix to prevent stent restenosis.;He should be reassured" V.O Dr. Adline Mango, RN

## 2011-09-18 NOTE — Telephone Encounter (Signed)
Pt calling stating that his right hand is swollen and hot. hasnt hit it on anything.

## 2011-09-18 NOTE — Telephone Encounter (Signed)
Pt says his right hand appears bruised, like he "hit it on something", but he denies any recent injuries to his hand. He says it is not same hand IV was in for PV procedure last week.  He confirms compliance with Plavix and ASA. He denies hand being warm to touch. I told him I would share info with Dr. Kirke Corin and call him back.. Understanding verb.

## 2011-09-22 ENCOUNTER — Other Ambulatory Visit: Payer: Self-pay | Admitting: Cardiology

## 2011-09-22 DIAGNOSIS — I739 Peripheral vascular disease, unspecified: Secondary | ICD-10-CM

## 2011-09-24 ENCOUNTER — Encounter: Payer: Self-pay | Admitting: Pulmonary Disease

## 2011-09-27 ENCOUNTER — Encounter: Payer: Self-pay | Admitting: Cardiovascular Disease

## 2011-09-28 ENCOUNTER — Encounter (INDEPENDENT_AMBULATORY_CARE_PROVIDER_SITE_OTHER): Payer: No Typology Code available for payment source

## 2011-09-28 DIAGNOSIS — I739 Peripheral vascular disease, unspecified: Secondary | ICD-10-CM

## 2011-10-05 ENCOUNTER — Telehealth: Payer: Self-pay

## 2011-10-05 ENCOUNTER — Ambulatory Visit (INDEPENDENT_AMBULATORY_CARE_PROVIDER_SITE_OTHER): Payer: No Typology Code available for payment source | Admitting: Cardiovascular Disease

## 2011-10-05 ENCOUNTER — Encounter: Payer: Self-pay | Admitting: Cardiovascular Disease

## 2011-10-05 VITALS — BP 118/64 | HR 76 | Ht 65.0 in | Wt 134.0 lb

## 2011-10-05 DIAGNOSIS — I2510 Atherosclerotic heart disease of native coronary artery without angina pectoris: Secondary | ICD-10-CM

## 2011-10-05 DIAGNOSIS — R079 Chest pain, unspecified: Secondary | ICD-10-CM

## 2011-10-05 DIAGNOSIS — R0602 Shortness of breath: Secondary | ICD-10-CM

## 2011-10-05 DIAGNOSIS — I739 Peripheral vascular disease, unspecified: Secondary | ICD-10-CM

## 2011-10-05 DIAGNOSIS — I714 Abdominal aortic aneurysm, without rupture: Secondary | ICD-10-CM

## 2011-10-05 DIAGNOSIS — E785 Hyperlipidemia, unspecified: Secondary | ICD-10-CM

## 2011-10-05 MED ORDER — CLOPIDOGREL BISULFATE 75 MG PO TABS: 75.0000 mg | ORAL_TABLET | Freq: Every day | ORAL | Status: DC

## 2011-10-05 NOTE — Assessment & Plan Note (Addendum)
He has a chronically occluded RCA with good left-to-right collaterals and moderate proximal LAD stenosis. I recommend continuing medical therapy. I asked him to start an exercise program. He is already enrolled in pulmonary rehabilitation.

## 2011-10-05 NOTE — Telephone Encounter (Signed)
That is fine with me.

## 2011-10-05 NOTE — Progress Notes (Signed)
HPI  This is a 61 year old man who is here today for a followup visit. The patient has extensive medical problems. He has severe COPD. 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 He had endovascular repair done in January of 2013 by Dr. dew. The patient also had significant exertional dyspnea and tachycardia. He underwent a pharmacologic nuclear stress test which showed no evidence of ischemia with normal ejection fraction. There was possibility of a previous inferior apical infarct which was relatively small to moderate in size. He had an echocardiogram done which was overall unremarkable.  He also had significant right lower extremity claudication. Thus, I performed both cardiac catheterization, abdominal aortogram and lower extremity runoff at the same time. Cardiac catheterization showed an occluded RCA with left to right collaterals and moderate 50% stenosis in the proximal LAD. Peripheral angiography showed patent aortoiliac stents with significant 80% stenosis in the right common iliac artery distal to the previously placed stent. I thus placed a self-expanding stent and overlapped in with the previously placed stent without complications. His right lower extremity claudication resolved. He denies chest pain. His dyspnea and tachycardia has improved after the addition of diltiazem.  Allergies  Allergen Reactions  . Citalopram     Celexa= "felt drunk"  . Mirtazapine     Remeron= "felt drunk:  . Plavix (Clopidogrel Bisulfate)     Felt drunk  . Sertraline Hcl     Zoloft= "felt drunk"  . Penicillins Hives and Rash     Current Outpatient Prescriptions on File Prior to Visit  Medication Sig Dispense Refill  . albuterol (PROVENTIL HFA;VENTOLIN HFA) 108 (90 BASE) MCG/ACT inhaler Inhale 2 puffs into the lungs every 6 (six) hours as needed. For shortness of breath      . albuterol (PROVENTIL) (2.5 MG/3ML) 0.083% nebulizer solution Take 2.5 mg by  nebulization 2 (two) times daily.      Marland Kitchen alprazolam (XANAX) 2 MG tablet Take 2 mg by mouth 2 (two) times daily.       Marland Kitchen atorvastatin (LIPITOR) 20 MG tablet Take 1 tablet (20 mg total) by mouth daily.  30 tablet  6  . diltiazem (CARDIZEM CD) 120 MG 24 hr capsule Take 1 capsule (120 mg total) by mouth daily.  30 capsule  6  . doxycycline (ADOXA) 100 MG tablet Take 100 mg by mouth 2 (two) times daily.      . Fluticasone-Salmeterol (ADVAIR DISKUS) 250-50 MCG/DOSE AEPB Inhale 1 puff into the lungs every 12 (twelve) hours.      . folic acid (FOLVITE) 1 MG tablet Take 1 mg by mouth daily.      Marland Kitchen guaiFENesin-codeine (ROBITUSSIN AC) 100-10 MG/5ML syrup Take 5 mLs by mouth 3 (three) times daily as needed. For cough      . nicotine (NICODERM CQ - DOSED IN MG/24 HOURS) 14 mg/24hr patch Place 1 patch onto the skin daily.      Marland Kitchen PRESCRIPTION MEDICATION Take 500 mg by mouth daily. "dilaresp"      . tiotropium (SPIRIVA) 18 MCG inhalation capsule Place 18 mcg into inhaler and inhale daily.      . vitamin B-12 (CYANOCOBALAMIN) 500 MCG tablet Take 500 mcg by mouth at bedtime.          Past Medical History  Diagnosis Date  . COPD (chronic obstructive pulmonary disease)   . Mold exposure   . Tobacco dependency   . Anxiety   . Hypercholesteremia   . COPD (  chronic obstructive pulmonary disease)   . Hypertension   . Peripheral vascular disease     Angiography in August of 2013 showed patent aortoiliac stent graft with no significant leaks, 80% stenosis in the right common iliac artery just distal to the stent. He had a successful self-expanding stent placement.  . History of multiple pulmonary nodules   . Depression   . AAA (abdominal aortic aneurysm)     s/p endovascular repair by Dr. Wyn Quaker in 01/2011  . Coronary artery disease 08/2011    Cardiac cath: Occluded RCA with good left-to-right collaterals, 50% proximal LAD stenosis.     Past Surgical History  Procedure Date  . Cataract extraction 12/20/2010      left eye  . Vein surgery 02/10/2011  . Resection of fatty tumor     back  . Angioplasty / stenting iliac 2013  . Cardiac catheterization 09/13/2011    [facility] iliac artery stent placement  . Abdominal aortic aneurysm repair     Stent graft done by Dr. dew.     Family History  Problem Relation Age of Onset  . COPD Mother   . Aneurysm Father      History   Social History  . Marital Status: Married    Spouse Name: N/A    Number of Children: 1  . Years of Education: N/A   Occupational History  . Maintenence work    Social History Main Topics  . Smoking status: Current Every Day Smoker -- 0.3 packs/day for 35 years    Types: Cigarettes  . Smokeless tobacco: Never Used  . Alcohol Use: No  . Drug Use: No  . Sexually Active: Not on file   Other Topics Concern  . Not on file   Social History Narrative  . No narrative on file     PHYSICAL EXAM   BP 118/64  Pulse 76  Ht 5\' 5"  (1.651 m)  Wt 134 lb (60.782 kg)  BMI 22.30 kg/m2 Constitutional: He is oriented to person, place, and time. He appears well-developed and well-nourished. No distress.  HENT: No nasal discharge.  Head: Normocephalic and atraumatic.  Eyes: Pupils are equal and round. Right eye exhibits no discharge. Left eye exhibits no discharge.  Neck: Normal range of motion. Neck supple. No JVD present. No thyromegaly present.  Cardiovascular: Normal rate, regular rhythm, normal heart sounds and. Exam reveals no gallop and no friction rub. No murmur heard.  Pulmonary/Chest: Effort normal and diminished breath sounds. No stridor. No respiratory distress. He has no wheezes. He has no rales. He exhibits no tenderness.  Abdominal: Soft. Bowel sounds are normal. He exhibits no distension. There is no tenderness. There is no rebound and no guarding.  Musculoskeletal: Normal range of motion. He exhibits no edema and no tenderness.  Neurological: He is alert and oriented to person, place, and time.  Coordination normal.  Skin: Skin is warm and dry. No rash noted. He is not diaphoretic. No erythema. No pallor.  Psychiatric: He has a normal mood and affect. His behavior is normal. Judgment and thought content normal.  Right groin: No hematoma. The femoral pulse is now normal. Distal pulses are also palpable     EKG: Sinus  Rhythm  - occasional ectopic ventricular beat    Low voltage in limb leads.   -Incomplete right bundle branch block.    ASSESSMENT AND PLAN

## 2011-10-05 NOTE — Telephone Encounter (Signed)
[facility] would like a letter stating patient can exercise again.  Please contact Sidney Ace at  Fax [phone]  Ph: [phone].

## 2011-10-05 NOTE — Assessment & Plan Note (Signed)
His claudication resolved after recent placement of a self-expanding stent in the right common iliac artery. Post procedure ABI was normal bilaterally. He is having extensive bruising on both aspirin and Plavix. Thus, aspirin will be discontinued today.

## 2011-10-05 NOTE — Assessment & Plan Note (Signed)
Recent angiography showed patent stent graft with no significant stenosis.

## 2011-10-05 NOTE — Patient Instructions (Addendum)
Stop Aspirin.  Continue other medications.  You need to get labs done (you have to be fasting for 6 hours).  Keep appointment next month.

## 2011-10-05 NOTE — Assessment & Plan Note (Signed)
He will need a followup fasting lipid and liver profile.

## 2011-10-06 ENCOUNTER — Other Ambulatory Visit (INDEPENDENT_AMBULATORY_CARE_PROVIDER_SITE_OTHER): Payer: No Typology Code available for payment source

## 2011-10-06 DIAGNOSIS — E785 Hyperlipidemia, unspecified: Secondary | ICD-10-CM

## 2011-10-06 NOTE — Telephone Encounter (Signed)
Letter faxed to pulmonary rehab at [facility].

## 2011-10-07 LAB — LIPID PANEL
Chol/HDL Ratio: 2.9 ratio units (ref 0.0–5.0)
VLDL Cholesterol Cal: 16 mg/dL (ref 5–40)

## 2011-10-07 LAB — HEPATIC FUNCTION PANEL
Albumin: 4.1 g/dL (ref 3.6–4.8)
Alkaline Phosphatase: 108 IU/L — ABNORMAL HIGH (ref 44–102)
Bilirubin, Direct: 0.12 mg/dL (ref 0.00–0.40)
Total Protein: 6.7 g/dL (ref 6.0–8.5)

## 2011-10-09 ENCOUNTER — Encounter: Payer: Self-pay | Admitting: *Deleted

## 2011-10-09 ENCOUNTER — Telehealth: Payer: Self-pay | Admitting: Pulmonary Disease

## 2011-10-09 NOTE — Telephone Encounter (Signed)
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 Last OV 07-31-11. Pt states today he has begun to have increased SOB with activity. He states he went to pulm rehab today at [facility] and his saturations decreased to 87% on 2 liters pulsed with his portable machine. Pt states they placed him on a larger canister on 2 liters continuous and his oxygen increased to around 93% and he was able to do some activity with the larger tank,, however he still felt more SOB then usual.  Pt states his sats usually stay above 90% with activity with the 2 liters pulsed. He denies any increase in cough, denies chest tightness or wheezing at this time. He states he is not SOB at rest. Please advise. Carron Curie, CMA Allergies  Allergen Reactions  . Citalopram     Celexa= "felt drunk"  . Mirtazapine     Remeron= "felt drunk:  Marland Kitchen Sertraline Hcl     Zoloft= "felt drunk"  . Penicillins Hives and Rash

## 2011-10-09 NOTE — Telephone Encounter (Signed)
I spoke with pt and is aware of RA recs. He voiced his understanding and will try increasing his oxygen. Will also forward to Dr. Kendrick Fries so he is aware.

## 2011-10-09 NOTE — Telephone Encounter (Signed)
Increase portable to 3-4 L - as needed to keep satn 88% & above during exercise If this does not work, then he may need continuous O2 durign exercise

## 2011-10-10 ENCOUNTER — Telehealth: Payer: Self-pay | Admitting: Pulmonary Disease

## 2011-10-10 NOTE — Telephone Encounter (Signed)
I spoke with the pt and he called yesterday c/o increased SOB with activity even with using oxygen.  Dr. Vassie Loll advised him to increase O2 to 3-4 liters with activity. Yesterday pt denied any cough, congestion, wheezing, or chest tightness. Today I spoke with the pt and he is now c/o increase cough, chest congestion, and SOB. Offered pt an appt here today but he could not come at that time. Pt states he will call PCP or go to Urgent in [place] closer to home. I advised that it is best that he is seen somewhere today due to his symptoms. Pt states understanding.  I advised if he changes his mind to call us back. Carron Curie, CMA

## 2011-10-13 NOTE — Telephone Encounter (Signed)
Noted, thanks!

## 2011-10-24 ENCOUNTER — Telehealth: Payer: Self-pay | Admitting: Pulmonary Disease

## 2011-10-24 ENCOUNTER — Encounter: Payer: Self-pay | Admitting: Pulmonary Disease

## 2011-10-24 ENCOUNTER — Ambulatory Visit (INDEPENDENT_AMBULATORY_CARE_PROVIDER_SITE_OTHER): Payer: PRIVATE HEALTH INSURANCE | Admitting: Pulmonary Disease

## 2011-10-24 VITALS — BP 100/60 | HR 78 | Temp 97.7°F | Ht 63.0 in | Wt 130.0 lb

## 2011-10-24 DIAGNOSIS — J309 Allergic rhinitis, unspecified: Secondary | ICD-10-CM | POA: Insufficient documentation

## 2011-10-24 DIAGNOSIS — J449 Chronic obstructive pulmonary disease, unspecified: Secondary | ICD-10-CM

## 2011-10-24 MED ORDER — PREDNISONE 10 MG PO TABS: ORAL_TABLET | ORAL | Status: DC

## 2011-10-24 MED ORDER — FLUTICASONE PROPIONATE 50 MCG/ACT NA SUSP: 1.0000 | Freq: Every day | NASAL | Status: DC

## 2011-10-24 NOTE — Assessment & Plan Note (Signed)
Plan: -Start Flonase 2 puffs each nares twice a day -Over-the-counter decongestants -Instructed using Neil-Med rinses with distilled water at least twice a day.

## 2011-10-24 NOTE — Progress Notes (Signed)
Subjective:    Patient ID: Larry Walters, male    DOB: August 30, 1950, 61 y.o.   MRN: [id number]  Synopsis: Mr. Tierce was first evaluated by the [facility] in the Spring of 2013 for COPD.  He has smoked 1/3 ppd for 10.5 years and continues to smoke.  GOLD Grade D.  He was hospitalized for a flare in June 2013 at [facility].  HPI  07/03/11 ROV -- Since the last visit Mr. Weltz was hospitalized at [facility] for a COPD flare.  He stayed in the hospital for four days and was treated with prednisone and antibiotics.  His breathing difficulty and wheezing have improved since then.  He continues to smoke.  He has been using his Advair twice per day.  He does not have significant chest pain, but notes some chest pressure with exertion.  He uses oxygen with exertion regularly.  He is awaiting pulmonary rehab on July 16th.  He has been having a lot of depression symptoms recently as well as anxiety.  He states that a lot of his depression is based in his chronic everyday dyspnea.  He is not ready to quit smoking.  07/31/11 ROV -- oxygen level up above 95% most of the time;  Likes roflumilast, it is helping quite a bit, cough improved, less congestion, shortness of breath improved,   10/24/11 ROV -- Ramen returns to clinic today stating that he was recently treated with doxycycline about 2 weeks ago for an episode of bronchitis. He states his sputum production has decreased and overall he has improved somewhat but he is still feeling more short of breath than baseline he has noticed that he's been wheezing more lately he was not she'd with prednisone. He's also noticed that when he goes outside he started to develop a lot of nasal congestion and stuffiness. This makes a lot harder for him to use his oxygen. He denies fevers chills cough or chest pain. He is been participating in pulmonary rehabilitation.   Past Medical History  Diagnosis Date  . COPD (chronic obstructive pulmonary disease)   . Mold  exposure   . Tobacco dependency   . Anxiety   . Hypercholesteremia   . COPD (chronic obstructive pulmonary disease)   . Hypertension   . Peripheral vascular disease     Angiography in August of 2013 showed patent aortoiliac stent graft with no significant leaks, 80% stenosis in the right common iliac artery just distal to the stent. He had a successful self-expanding stent placement.  . History of multiple pulmonary nodules   . Depression   . AAA (abdominal aortic aneurysm)     s/p endovascular repair by Dr. Wyn Quaker in 01/2011  . Coronary artery disease 08/2011    Cardiac cath: Occluded RCA with good left-to-right collaterals, 50% proximal LAD stenosis.     Review of Systems  Constitutional: Positive for fatigue. Negative for fever and chills.  HENT: Negative for congestion, rhinorrhea, sneezing and postnasal drip.   Respiratory: Positive for cough, shortness of breath and wheezing.   Cardiovascular: Negative for chest pain and leg swelling.       Objective:   Physical Exam   Filed Vitals:   10/24/11 1658  BP: 100/60  Pulse: 78  Temp: 97.7 F (36.5 C)  TempSrc: Oral  Height: 5\' 3"  (1.6 m)  Weight: 130 lb (58.968 kg)  SpO2: 94%   Gen: chronically ill appearing, no acute distress HEENT: NCAT, PERRL, EOMi, OP clear, neck supple without masses PULM: Poor  air movement bilatearlly, some crackles in bases CV: RRR, no mgr, no JVD AB: BS+, soft, nontender, no hsm Ext: warm, no edema, no clubbing, no cyanosis  04/2010 PFT from [place] [facility]: FEV1 34% predicted, DLCO 44% predicted     Assessment & Plan:   COPD (chronic obstructive pulmonary disease)  Nadav presents today with symptoms consistent with an acute exacerbation of COPD. He has not had a fever or chills I don't think he needs any more antibiotics as he recently completed a course of doxycycline. We will check a chest x-ray to make sure that he doesn't have pneumonia but I doubt that this is the  case.  Plan: -prednisone taper -He was instructed to use Lloyd Huger med rinses for sinus congestion and over-the-counter decongestants. -Continue using oxygen -Call us if not better -Followup with pulmonary rehabilitation  Rhinitis, allergic Plan: -Start Flonase 2 puffs each nares twice a day -Over-the-counter decongestants -Instructed using Neil-Med rinses with distilled water at least twice a day.    Updated Medication List Outpatient Encounter Prescriptions as of 10/24/2011  Medication Sig Dispense Refill  . albuterol (PROVENTIL HFA;VENTOLIN HFA) 108 (90 BASE) MCG/ACT inhaler Inhale 2 puffs into the lungs every 6 (six) hours as needed. For shortness of breath      . albuterol (PROVENTIL) (2.5 MG/3ML) 0.083% nebulizer solution Take 2.5 mg by nebulization 2 (two) times daily.      Marland Kitchen alprazolam (XANAX) 2 MG tablet Take 2 mg by mouth 2 (two) times daily.       Marland Kitchen atorvastatin (LIPITOR) 20 MG tablet Take 1 tablet (20 mg total) by mouth daily.  30 tablet  6  . clopidogrel (PLAVIX) 75 MG tablet Take 1 tablet (75 mg total) by mouth daily.  30 tablet  6  . diltiazem (CARDIZEM CD) 120 MG 24 hr capsule Take 1 capsule (120 mg total) by mouth daily.  30 capsule  6  . doxycycline (ADOXA) 100 MG tablet Take 100 mg by mouth 2 (two) times daily.      . Fluticasone-Salmeterol (ADVAIR DISKUS) 250-50 MCG/DOSE AEPB Inhale 1 puff into the lungs every 12 (twelve) hours.      . folic acid (FOLVITE) 1 MG tablet Take 1 mg by mouth daily.      Marland Kitchen guaiFENesin-codeine (ROBITUSSIN AC) 100-10 MG/5ML syrup Take 5 mLs by mouth 3 (three) times daily as needed. For cough      . nicotine (NICODERM CQ - DOSED IN MG/24 HOURS) 14 mg/24hr patch Place 1 patch onto the skin daily.      Marland Kitchen PRESCRIPTION MEDICATION Take 500 mg by mouth daily. "dilaresp"      . tiotropium (SPIRIVA) 18 MCG inhalation capsule Place 18 mcg into inhaler and inhale daily.      . vitamin B-12 (CYANOCOBALAMIN) 500 MCG tablet Take 500 mcg by mouth at bedtime.

## 2011-10-24 NOTE — Patient Instructions (Signed)
Use Neil Med rinses with distilled water at least twice per day using the instructions on the package. 1/2 hour after using the [facility] Med rinse, use Nasonex two puffs in each nostril once per day. Use chlortrimeton and an over the counter decongestant (pseudophed or phenylephrine) as needed for the cough. Take the prednisone as written. Call us if you are not getting better We will see you back as previously scheduled

## 2011-10-24 NOTE — Assessment & Plan Note (Signed)
Larry Walters presents today with symptoms consistent with an acute exacerbation of COPD. He has not had a fever or chills I don't think he needs any more antibiotics as he recently completed a course of doxycycline. We will check a chest x-ray to make sure that he doesn't have pneumonia but I doubt that this is the case.  Plan: -prednisone taper -He was instructed to use Lloyd Huger med rinses for sinus congestion and over-the-counter decongestants. -Continue using oxygen -Call us if not better -Followup with pulmonary rehabilitation

## 2011-10-24 NOTE — Telephone Encounter (Signed)
Pt decided to make an appt w/ BQ today instead of leaving a message.  Larry Walters

## 2011-10-25 ENCOUNTER — Ambulatory Visit (INDEPENDENT_AMBULATORY_CARE_PROVIDER_SITE_OTHER)
Admission: RE | Admit: 2011-10-25 | Discharge: 2011-10-25 | Disposition: A | Discharge status: Home / Self Care | Payer: No Typology Code available for payment source | Source: Ambulatory Visit | Attending: Pulmonary Disease | Admitting: Pulmonary Disease

## 2011-10-25 ENCOUNTER — Telehealth: Payer: Self-pay | Admitting: Pulmonary Disease

## 2011-10-25 DIAGNOSIS — J449 Chronic obstructive pulmonary disease, unspecified: Secondary | ICD-10-CM

## 2011-10-25 NOTE — Telephone Encounter (Signed)
Per 10.1.13 ov note w/ BQ:   Patient Instructions     Use Neil Med rinses with distilled water at least twice per day using the instructions on the package.  1/2 hour after using the [facility] Med rinse, use Nasonex two puffs in each nostril once per day.  Use chlortrimeton and an over the counter decongestant (pseudophed or phenylephrine) as needed for the cough.  Take the prednisone as written.  Call us if you are not getting better  We will see you back as previously scheduled    LMOM TCB x1.

## 2011-10-25 NOTE — Telephone Encounter (Signed)
lmomtcb  

## 2011-10-25 NOTE — Telephone Encounter (Signed)
Pt returned call. Larry Walters °

## 2011-10-25 NOTE — Telephone Encounter (Signed)
Pt returned call. Larry Walters  

## 2011-10-26 NOTE — Telephone Encounter (Signed)
lmomtcb x 2  

## 2011-10-27 ENCOUNTER — Telehealth: Payer: Self-pay | Admitting: Pulmonary Disease

## 2011-10-27 ENCOUNTER — Telehealth: Payer: Self-pay | Admitting: Cardiovascular Disease

## 2011-10-27 ENCOUNTER — Other Ambulatory Visit: Payer: Self-pay

## 2011-10-27 MED ORDER — DILTIAZEM HCL ER COATED BEADS 120 MG PO CP24: ORAL_CAPSULE | ORAL | Status: DC

## 2011-10-27 NOTE — Telephone Encounter (Addendum)
We can increase Diltiazem ER to 180 mg once daily to help with tachycardia.

## 2011-10-27 NOTE — Telephone Encounter (Signed)
Pt informed Understanding verb 

## 2011-10-27 NOTE — Telephone Encounter (Signed)
Pt calling states that his HR is elevated when he takes off his O2. Pt wants to know what he should do

## 2011-10-27 NOTE — Telephone Encounter (Signed)
LMOMTCB x 1 

## 2011-10-27 NOTE — Telephone Encounter (Signed)
Pt says he saw Dr. Kendrick Fries recently and CXR was ordered. He is wearing O2 at hs only. He is complaining that HR goes up to 122 BPM with exertion. He is having wear O2 all the time instead of just at hs.  He says O2 improves HR.  He confirms compliance with diltiazem.  I had pt hold while I discussed with Dr. Kirke Corin.  Per Dr. Kirke Corin, pt should discuss with pulmonology. i explained this to pt who verb. Understanding.  He then tells me he took a sudafed tablet last night. I explained to him how this may increase his HR as well. I advised him to refrain from taking any more sudafed. I encouraged him to contact Dr. Ulyses Jarred office about O2, SOB. He reassures me he has already placed a call; to them and is awaiting a return call.  I advised him to call them again to see if they can help him before w/e. He will call us back should he have further questions, or symptoms get worse.

## 2011-10-27 NOTE — Telephone Encounter (Signed)
Phone call to patient and recs given per Dr Kendrick Fries. Pt expressed understanding and will call if anything further needed.  Victorino Dike Castillo//Callee Rohrig Mabe, CMA

## 2011-10-27 NOTE — Telephone Encounter (Signed)
Please see below. FYI 

## 2011-10-27 NOTE — Telephone Encounter (Signed)
Please let him know that I looked at his CXR from the other day and compared it with his studies from [facility] and it was fine.  Have him use his oxygen with exertion and with sleep.  If ne has not improved then he will need to be seen at an ER over the weekend.

## 2011-10-27 NOTE — Telephone Encounter (Signed)
I spoke with the patient and he wants CXR results. He is also c/o increased SOB with activity since OV on 10-24-11. Pt states he has also noticed that his HR is increasing to 120. Pt states if he wears his oxygen with activity his SOB is better as well as his HR. I asked the pt why he is not using his oxygen and he states he is only supposed to use it at bedtime not with activity. Pt states he is taking prednisone as directed. He denies any cough or congestion. Please advise.Carron Curie, CMA

## 2011-10-30 NOTE — Telephone Encounter (Signed)
I spoke with pt and he stated he already had CXR 10/25/11. He did not need anything further

## 2011-11-01 ENCOUNTER — Ambulatory Visit (HOSPITAL_COMMUNITY): Payer: Self-pay | Admitting: Psychiatry

## 2011-11-01 ENCOUNTER — Telehealth: Payer: Self-pay | Admitting: Cardiovascular Disease

## 2011-11-01 NOTE — Telephone Encounter (Signed)
Pt needs to speak to nurse about come concerns he has about medications.

## 2011-11-01 NOTE — Telephone Encounter (Signed)
Pt says since increasing Cardizem to 180 mg daily, he has noticed "shakes". He says HR is better controlled and wants to know if ok to resume the 120 mg dose.  I advised ok to try this and he should call us back if this doesn't help. Understanding verb.

## 2011-11-02 ENCOUNTER — Encounter: Payer: Self-pay | Admitting: *Deleted

## 2011-11-02 ENCOUNTER — Telehealth: Payer: Self-pay | Admitting: Pulmonary Disease

## 2011-11-02 NOTE — Telephone Encounter (Signed)
Pt requesting samples of Daliresp. Per Verlon Au, she will take a few samples to the [place] office on Mon., 11/06/11 so the pt may pick them up. Pt is aware

## 2011-11-07 ENCOUNTER — Ambulatory Visit (INDEPENDENT_AMBULATORY_CARE_PROVIDER_SITE_OTHER): Payer: No Typology Code available for payment source | Admitting: Cardiovascular Disease

## 2011-11-07 ENCOUNTER — Encounter: Payer: Self-pay | Admitting: Cardiovascular Disease

## 2011-11-07 VITALS — BP 124/62 | HR 72 | Ht 65.0 in | Wt 127.5 lb

## 2011-11-07 DIAGNOSIS — I6529 Occlusion and stenosis of unspecified carotid artery: Secondary | ICD-10-CM

## 2011-11-07 DIAGNOSIS — R0602 Shortness of breath: Secondary | ICD-10-CM

## 2011-11-07 DIAGNOSIS — E785 Hyperlipidemia, unspecified: Secondary | ICD-10-CM

## 2011-11-07 DIAGNOSIS — R079 Chest pain, unspecified: Secondary | ICD-10-CM

## 2011-11-07 DIAGNOSIS — I739 Peripheral vascular disease, unspecified: Secondary | ICD-10-CM

## 2011-11-07 DIAGNOSIS — I2510 Atherosclerotic heart disease of native coronary artery without angina pectoris: Secondary | ICD-10-CM

## 2011-11-07 NOTE — Patient Instructions (Addendum)
Continue same medications.  You can resume pulmonary rehab.  Follow up in 3 months.

## 2011-11-07 NOTE — Progress Notes (Signed)
HPI  This is a 61 year old man who is here today for a followup visit. The patient has extensive medical problems. He has severe COPD. 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 He had endovascular repair done in January of 2013 by Dr. dew. The patient also had significant exertional dyspnea and tachycardia. He underwent a pharmacologic nuclear stress test which showed no evidence of ischemia with normal ejection fraction. There was possibility of a previous inferior apical infarct which was relatively small to moderate in size. He had an echocardiogram done which was overall unremarkable.  He also had significant right lower extremity claudication. Thus, I performed both cardiac catheterization, abdominal aortogram and lower extremity runoff at the same time. Cardiac catheterization showed an occluded RCA with left to right collaterals and moderate 50% stenosis in the proximal LAD. Peripheral angiography showed patent aortoiliac stents with significant 80% stenosis in the right common iliac artery distal to the previously placed stent. I thus placed a self-expanding stent and overlapped in with the previously placed stent without complications. His right lower extremity claudication resolved.  He had worsening COPD he recently with increased tachycardia. Thus, I increased his diltiazem 180 mg once daily. However, he did not tolerate that dose and went back to 120 mg once daily. He was placed on prednisone for COPD. He is currently feeling better. He quit smoking last week. He is concerned about a 7 pound weight loss.  Allergies  Allergen Reactions  . Citalopram     Celexa= "felt drunk"  . Mirtazapine     Remeron= "felt drunk:  Marland Kitchen Sertraline Hcl     Zoloft= "felt drunk"  . Penicillins Hives and Rash     Current Outpatient Prescriptions on File Prior to Visit  Medication Sig Dispense Refill  . albuterol (PROVENTIL) (2.5 MG/3ML) 0.083% nebulizer  solution Take 2.5 mg by nebulization 2 (two) times daily.      Marland Kitchen atorvastatin (LIPITOR) 20 MG tablet Take 1 tablet (20 mg total) by mouth daily.  30 tablet  6  . clopidogrel (PLAVIX) 75 MG tablet Take 1 tablet (75 mg total) by mouth daily.  30 tablet  6  . doxycycline (ADOXA) 100 MG tablet Take 100 mg by mouth 2 (two) times daily.      . fluticasone (FLONASE) 50 MCG/ACT nasal spray Place 1 spray into the nose daily.  16 g  3  . Fluticasone-Salmeterol (ADVAIR DISKUS) 250-50 MCG/DOSE AEPB Inhale 1 puff into the lungs every 12 (twelve) hours.      Marland Kitchen PRESCRIPTION MEDICATION Take 500 mg by mouth daily. "dilaresp"      . tiotropium (SPIRIVA) 18 MCG inhalation capsule Place 18 mcg into inhaler and inhale daily.      Marland Kitchen DISCONTD: albuterol (PROVENTIL HFA;VENTOLIN HFA) 108 (90 BASE) MCG/ACT inhaler Inhale 2 puffs into the lungs every 6 (six) hours as needed. For shortness of breath      . DISCONTD: diltiazem (CARDIZEM CD) 120 MG 24 hr capsule Take 1 1/2 tablets daily to equal 180 mg daily  45 capsule  6     Past Medical History  Diagnosis Date  . COPD (chronic obstructive pulmonary disease)   . Mold exposure   . Tobacco dependency   . Anxiety   . Hypercholesteremia   . COPD (chronic obstructive pulmonary disease)   . Hypertension   . Peripheral vascular disease     Angiography in August of 2013 showed patent aortoiliac stent graft with  no significant leaks, 80% stenosis in the right common iliac artery just distal to the stent. He had a successful self-expanding stent placement.  . History of multiple pulmonary nodules   . Depression   . AAA (abdominal aortic aneurysm)     s/p endovascular repair by Dr. Wyn Quaker in 01/2011  . Coronary artery disease 08/2011    Cardiac cath: Occluded RCA with good left-to-right collaterals, 50% proximal LAD stenosis.     Past Surgical History  Procedure Date  . Cataract extraction 12/20/2010    left eye  . Vein surgery 02/10/2011  . Resection of fatty tumor      back  . Angioplasty / stenting iliac 2013  . Cardiac catheterization 09/13/2011    [facility] iliac artery stent placement  . Abdominal aortic aneurysm repair     Stent graft done by Dr. dew.     Family History  Problem Relation Age of Onset  . COPD Mother   . Aneurysm Father      History   Social History  . Marital Status: Married    Spouse Name: N/A    Number of Children: 1  . Years of Education: N/A   Occupational History  . Maintenence work    Social History Main Topics  . Smoking status: Former Smoker -- 0.3 packs/day for 35 years    Types: Cigarettes  . Smokeless tobacco: Never Used  . Alcohol Use: No  . Drug Use: No  . Sexually Active: Not on file   Other Topics Concern  . Not on file   Social History Narrative  . No narrative on file     PHYSICAL EXAM   BP 124/62  Pulse 72  Ht 5\' 5"  (1.651 m)  Wt 127 lb 8 oz (57.834 kg)  BMI 21.22 kg/m2 Constitutional: He is oriented to person, place, and time. He appears well-developed and well-nourished. No distress.  HENT: No nasal discharge.  Head: Normocephalic and atraumatic.  Eyes: Pupils are equal and round. Right eye exhibits no discharge. Left eye exhibits no discharge.  Neck: Normal range of motion. Neck supple. No JVD present. No thyromegaly present.  Cardiovascular: Normal rate, regular rhythm, normal heart sounds and. Exam reveals no gallop and no friction rub. No murmur heard.  Pulmonary/Chest: Effort normal and diminished breath sounds. No stridor. No respiratory distress. He has no wheezes. He has no rales. He exhibits no tenderness.  Abdominal: Soft. Bowel sounds are normal. He exhibits no distension. There is no tenderness. There is no rebound and no guarding.  Musculoskeletal: Normal range of motion. He exhibits no edema and no tenderness.  Neurological: He is alert and oriented to person, place, and time. Coordination normal.  Skin: Skin is warm and dry. No rash noted. He is not diaphoretic.  No erythema. No pallor.  Psychiatric: He has a normal mood and affect. His behavior is normal. Judgment and thought content normal.  Right groin:The femoral pulse is  normal. Distal pulses are also palpable     EKG: Sinus  Rhythm  - occasional ectopic ventricular beat    Low voltage in limb leads.   -Incomplete right bundle branch block.    ASSESSMENT AND PLAN

## 2011-11-07 NOTE — Assessment & Plan Note (Signed)
He has a chronically occluded RCA with good left-to-right collaterals and moderate proximal LAD stenosis. I recommend continuing medical therapy. I asked him to resume pulmonary rehabilitation.

## 2011-11-07 NOTE — Assessment & Plan Note (Signed)
Lab Results  Component Value Date   HDL 44 10/06/2011   LDLCALC 66 10/06/2011   TRIG 79 10/06/2011   CHOLHDL 2.9 10/06/2011   His lipid profile was optimal. Continue treatment with atorvastatin.

## 2011-11-07 NOTE — Assessment & Plan Note (Signed)
His claudication resolved after recent placement of a self-expanding stent in the right common iliac artery. Post procedure ABI was normal bilaterally. Continue Plavix daily. He will need duplex ultrasound in 3 months.

## 2011-11-07 NOTE — Assessment & Plan Note (Signed)
Moderate right carotid stenosis which will be monitored. Continue medical therapy.

## 2011-11-21 ENCOUNTER — Ambulatory Visit: Payer: Self-pay | Admitting: Ophthalmology

## 2011-11-22 ENCOUNTER — Ambulatory Visit (HOSPITAL_COMMUNITY): Payer: Self-pay | Admitting: Psychiatry

## 2011-11-24 ENCOUNTER — Encounter: Payer: Self-pay | Admitting: Pulmonary Disease

## 2011-12-08 ENCOUNTER — Ambulatory Visit: Payer: Self-pay | Admitting: Family Medicine

## 2011-12-13 ENCOUNTER — Telehealth: Payer: Self-pay | Admitting: Pulmonary Disease

## 2011-12-13 NOTE — Telephone Encounter (Signed)
Lets try mucinex dm extra strength, and take one in am and pm with large glass of water.  If not improving by Friday am, or if he worsens, he is to call us.

## 2011-12-13 NOTE — Telephone Encounter (Signed)
Spoke with patient; states he is having mucus in chest and unable to bring it up; "choking feeling"(gagging) when attempting to get it out. Increased SOB. Pt has not tried any meds to help thin mucus; he would like recs from Dr first. Jonathon Bellows, as Doc-of-day, please advise. Thanks.    Allergies  Allergen Reactions  . Citalopram     Celexa= "felt drunk"  . Mirtazapine     Remeron= "felt drunk:  Marland Kitchen Sertraline Hcl     Zoloft= "felt drunk"  . Penicillins Hives and Rash

## 2011-12-13 NOTE — Telephone Encounter (Signed)
LMTCB

## 2011-12-13 NOTE — Telephone Encounter (Signed)
Spoke with patient-understands recomendations from [facility] and will call if worsens or no better by Friday am.

## 2011-12-19 ENCOUNTER — Telehealth: Payer: Self-pay | Admitting: Pulmonary Disease

## 2011-12-19 MED ORDER — ROFLUMILAST 500 MCG PO TABS: 500.0000 ug | ORAL_TABLET | Freq: Every day | ORAL | Status: DC

## 2011-12-19 NOTE — Telephone Encounter (Signed)
lmomtcb x1--which mailorder???

## 2011-12-19 NOTE — Telephone Encounter (Signed)
Pt aware [facility]. Nothing further was needed

## 2011-12-19 NOTE — Telephone Encounter (Signed)
[facility]

## 2011-12-19 NOTE — Telephone Encounter (Signed)
OK by me 

## 2011-12-19 NOTE — Telephone Encounter (Signed)
Please advise if okay to refill Daliresp? I don't see where this has been refilled by you before Dr. Kendrick Fries Thanks

## 2011-12-20 ENCOUNTER — Telehealth: Payer: Self-pay | Admitting: Cardiovascular Disease

## 2011-12-20 ENCOUNTER — Telehealth: Payer: Self-pay | Admitting: Pulmonary Disease

## 2011-12-20 NOTE — Telephone Encounter (Signed)
He has complex issues. Schedule him to see me.

## 2011-12-20 NOTE — Telephone Encounter (Signed)
Pt calling states he needs clearance to have gallbladder surgery. Wants to discuss this with nurse

## 2011-12-20 NOTE — Telephone Encounter (Signed)
Pt says he has gallstones and needs to have cholecystectomy Says this is not urgent Was seen by a surgeon at [facility]-Dr. Berenda Morale yesterday He was instructed to get surgical clearance from Dr. Kirke Corin as well as from pulmonologist Pt states he will need to be off Plavix x 6weeks before they would attempt surgery.  I will call surgeon to clarify Pt had new stent placed 4 months ago and AAA repair in January I explained there is a chance pt will not be allowed to hold Plavix this soon post stent I will call surgeon to get details and then will share info with Dr. Kirke Corin I will then call pt back with info/response Letter to be faxed to [phone]. Their phone # is [phone]

## 2011-12-20 NOTE — Telephone Encounter (Signed)
I spoke with surgeon's office who says she is going to call me back with info re: plavix

## 2011-12-20 NOTE — Telephone Encounter (Signed)
Pt schedule to see Dr. Kendrick Fries on Monday at 9:45. Carron Curie, CMA

## 2011-12-20 NOTE — Telephone Encounter (Signed)
[facility] surg called back. Says pt will only need to hold plavix x 5-7 days prior to surg. They ask for cardiac clearance before they schedule This should be faxed to [phone]

## 2011-12-20 NOTE — Telephone Encounter (Signed)
See below and advise Needs surgical clearance for cholecystectomy (elective) and to be able to hold plavix x 5-7 days

## 2011-12-20 NOTE — Telephone Encounter (Signed)
Pt informed He wishes to wait until scheduled appt in January to discuss He will call us sooner should he feel he needs sooner appt

## 2011-12-24 ENCOUNTER — Encounter: Payer: Self-pay | Admitting: Pulmonary Disease

## 2011-12-25 ENCOUNTER — Ambulatory Visit: Payer: Self-pay | Admitting: Pulmonary Disease

## 2011-12-25 ENCOUNTER — Ambulatory Visit (INDEPENDENT_AMBULATORY_CARE_PROVIDER_SITE_OTHER): Payer: Self-pay | Admitting: General Surgery

## 2011-12-26 ENCOUNTER — Ambulatory Visit (INDEPENDENT_AMBULATORY_CARE_PROVIDER_SITE_OTHER): Payer: No Typology Code available for payment source | Admitting: Pulmonary Disease

## 2011-12-26 ENCOUNTER — Encounter: Payer: Self-pay | Admitting: Pulmonary Disease

## 2011-12-26 VITALS — BP 116/50 | HR 80 | Temp 97.8°F | Ht 65.0 in | Wt 135.0 lb

## 2011-12-26 DIAGNOSIS — R5383 Other fatigue: Secondary | ICD-10-CM | POA: Insufficient documentation

## 2011-12-26 DIAGNOSIS — J449 Chronic obstructive pulmonary disease, unspecified: Secondary | ICD-10-CM

## 2011-12-26 NOTE — Assessment & Plan Note (Signed)
This is been a stable interval for Larry Walters. He really needs to quit smoking.  His surgical risk for a laparoscopic cholecystectomy by the [facility] respiratory failure for perioperative surgical complications is about 4%.  I explained to him that he should take his inhalers the day of surgery and should use incentive spirometry around the time of the surgery.  Plan:  -Continto yellow to ue Roflumilast, Spiriva, Advair -Continue oxygen with exertion I encouraged him to use this more regularly. -Flu shot is up-to-date

## 2011-12-26 NOTE — Progress Notes (Signed)
Subjective:    Patient ID: Larry Walters, male    DOB: [birth year], 61 y.o.   MRN: [id number]  Synopsis: Mr. Cooks was first evaluated by the [facility] in the Spring of 2013 for COPD.  He has smoked 1/3 ppd for 10.5 years and continues to smoke.  GOLD Grade D.  He was hospitalized for a flare in June 2013 at [facility].  HPI  07/03/11 ROV -- Since the last visit Mr. Reffett was hospitalized at [facility] for a COPD flare.  He stayed in the hospital for four days and was treated with prednisone and antibiotics.  His breathing difficulty and wheezing have improved since then.  He continues to smoke.  He has been using his Advair twice per day.  He does not have significant chest pain, but notes some chest pressure with exertion.  He uses oxygen with exertion regularly.  He is awaiting pulmonary rehab on July 16th.  He has been having a lot of depression symptoms recently as well as anxiety.  He states that a lot of his depression is based in his chronic everyday dyspnea.  He is not ready to quit smoking.  07/31/11 ROV -- oxygen level up above 95% most of the time;  Likes roflumilast, it is helping quite a bit, cough improved, less congestion, shortness of breath improved,   10/24/11 ROV -- Koben returns to clinic today stating that he was recently treated with doxycycline about 2 weeks ago for an episode of bronchitis. He states his sputum production has decreased and overall he has improved somewhat but he is still feeling more short of breath than baseline he has noticed that he's been wheezing more lately he was not she'd with prednisone. He's also noticed that when he goes outside he started to develop a lot of nasal congestion and stuffiness. This makes a lot harder for him to use his oxygen. He denies fevers chills cough or chest pain. He is been participating in pulmonary rehabilitation.  12/26/2011 ROV -- Jadien has been doing well since her last visit. His is improved in his cough  is at baseline. He was participating with pulmonary rehabilitation and he started to have abdominal pain and so he stopped. He's been found to have gallstones and there has been consideration made for cholecystectomy. Right now plans for that are on hold due to his Plavix. He still has some shortness of breath with exertion such as climbing a ladder or cleaning out his gutters. In general he is in much better spirits today than his previous visits and he seems to be more active. Lately he's been feeling more fatigued during the day, and has been taking a lot of naps. His wife says that he snores heavily and he notes that he frequently wakes up around 3 or 4 AM for unclear reasons.   Past Medical History  Diagnosis Date  . COPD (chronic obstructive pulmonary disease)   . Mold exposure   . Tobacco dependency   . Anxiety   . Hypercholesteremia   . COPD (chronic obstructive pulmonary disease)   . Hypertension   . Peripheral vascular disease     Angiography in August of 2013 showed patent aortoiliac stent graft with no significant leaks, 80% stenosis in the right common iliac artery just distal to the stent. He had a successful self-expanding stent placement.  . History of multiple pulmonary nodules   . Depression   . AAA (abdominal aortic aneurysm)     s/p endovascular repair  by Dr. Wyn Quaker in 01/2011  . Coronary artery disease 08/2011    Cardiac cath: Occluded RCA with good left-to-right collaterals, 50% proximal LAD stenosis.     Review of Systems  Constitutional: Positive for fatigue. Negative for fever and chills.  HENT: Negative for congestion, rhinorrhea, sneezing and postnasal drip.   Respiratory: Positive for shortness of breath. Negative for cough and wheezing.   Cardiovascular: Negative for chest pain and leg swelling.       Objective:   Physical Exam   Filed Vitals:   12/26/11 1009  BP: 116/50  Pulse: 80  Temp: 97.8 F (36.6 C)  TempSrc: Oral  Height: 5\' 5"  (1.651 m)   Weight: 135 lb (61.236 kg)  SpO2: 96%   Gen: chronically ill appearing, no acute distress HEENT: NCAT, PERRL, EOMi, OP clear, neck supple without masses PULM: CTA B today CV: RRR, no mgr, no JVD AB: BS+, soft, nontender, no hsm Ext: warm, no edema, no clubbing, no cyanosis  04/2010 PFT from [place] Pulmonologist: FEV1 34% predicted, DLCO 44% predicted     Assessment & Plan:   COPD (chronic obstructive pulmonary disease) This is been a stable interval for Whitaker. He really needs to quit smoking.  His surgical risk for a laparoscopic cholecystectomy by the [facility] respiratory failure for perioperative surgical complications is about 4%.  I explained to him that he should take his inhalers the day of surgery and should use incentive spirometry around the time of the surgery.  Plan:  -Continto yellow to ue Roflumilast, Spiriva, Advair -Continue oxygen with exertion I encouraged him to use this more regularly. -Flu shot is up-to-date  Fatigue He describes daytime somnolence, frequent napping, and poor sleeping at night. His wife is with him and says that he snores heavily at night.  Plan: -Referral for polysomnogram in [facility]    Updated Medication List Outpatient Encounter Prescriptions as of 12/26/2011  Medication Sig Dispense Refill  . albuterol (PROVENTIL) (2.5 MG/3ML) 0.083% nebulizer solution Take 2.5 mg by nebulization every 6 (six) hours as needed.       Marland Kitchen atorvastatin (LIPITOR) 20 MG tablet Take 1 tablet (20 mg total) by mouth daily.  30 tablet  6  . clopidogrel (PLAVIX) 75 MG tablet Take 1 tablet (75 mg total) by mouth daily.  30 tablet  6  . diltiazem (CARDIZEM CD) 120 MG 24 hr capsule Take 120 mg by mouth daily.      . Fluticasone-Salmeterol (ADVAIR DISKUS) 250-50 MCG/DOSE AEPB Inhale 1 puff into the lungs every 12 (twelve) hours.      . roflumilast (DALIRESP) 500 MCG TABS tablet Take 1 tablet (500 mcg total) by mouth daily.  90 tablet  1  . tiotropium  (SPIRIVA) 18 MCG inhalation capsule Place 18 mcg into inhaler and inhale daily.      . traMADol (ULTRAM) 50 MG tablet Take 50 mg by mouth every 6 (six) hours as needed.      . fluticasone (FLONASE) 50 MCG/ACT nasal spray Place 1 spray into the nose daily.  16 g  3  . [DISCONTINUED] doxycycline (ADOXA) 100 MG tablet Take 100 mg by mouth 2 (two) times daily.      . [DISCONTINUED] PRESCRIPTION MEDICATION Take 500 mg by mouth daily. "dilaresp"

## 2011-12-26 NOTE — Patient Instructions (Addendum)
Keep taking your Spiriva, Advair, and Roflumilast as you are doing We will refer you for a sleep study in [place] with Dr. Shelle Iron. If you choose to undergo surgery, take your inhalers the day of the surgery and use the incentive spirometer afterwards as much as possible We will see you back in 3-4 months or sooner if needed

## 2011-12-26 NOTE — Assessment & Plan Note (Signed)
He describes daytime somnolence, frequent napping, and poor sleeping at night. His wife is with him and says that he snores heavily at night.  Plan: -Referral for polysomnogram in [facility]

## 2011-12-27 ENCOUNTER — Telehealth: Payer: Self-pay | Admitting: Pulmonary Disease

## 2011-12-27 NOTE — Telephone Encounter (Signed)
Please advise if okay to prescribe something to help with sleep for the night of his PSG which is scheduled for 01/12/12, thanks!

## 2011-12-27 NOTE — Telephone Encounter (Signed)
Ambien 10mg  po qHS dispense 1.  Do not take with Xanax

## 2011-12-28 MED ORDER — ZOLPIDEM TARTRATE 10 MG PO TABS: ORAL_TABLET | ORAL | Status: DC

## 2011-12-28 NOTE — Telephone Encounter (Signed)
Pt is aware and wants this call to [facility] in [place]. Pt verbalized understanding and will not take this with the Xanax.

## 2012-01-02 ENCOUNTER — Encounter: Payer: Self-pay | Admitting: Pulmonary Disease

## 2012-01-08 ENCOUNTER — Ambulatory Visit (HOSPITAL_BASED_OUTPATIENT_CLINIC_OR_DEPARTMENT_OTHER): Payer: No Typology Code available for payment source | Attending: Pulmonary Disease | Admitting: Radiology

## 2012-01-08 DIAGNOSIS — R0989 Other specified symptoms and signs involving the circulatory and respiratory systems: Secondary | ICD-10-CM | POA: Insufficient documentation

## 2012-01-08 DIAGNOSIS — R0609 Other forms of dyspnea: Secondary | ICD-10-CM | POA: Insufficient documentation

## 2012-01-08 DIAGNOSIS — R5383 Other fatigue: Secondary | ICD-10-CM

## 2012-01-08 DIAGNOSIS — G4730 Sleep apnea, unspecified: Secondary | ICD-10-CM | POA: Insufficient documentation

## 2012-01-08 DIAGNOSIS — G4710 Hypersomnia, unspecified: Secondary | ICD-10-CM | POA: Insufficient documentation

## 2012-01-14 DIAGNOSIS — G4730 Sleep apnea, unspecified: Secondary | ICD-10-CM

## 2012-01-14 DIAGNOSIS — G4710 Hypersomnia, unspecified: Secondary | ICD-10-CM

## 2012-01-14 NOTE — Procedures (Cosign Needed)
NAMELELON, IKARD NO.:  [id number]  MEDICAL RECORD NO.:  [id number]          PATIENT TYPE:  OUT  LOCATION:  SLEEP CENTER                 FACILITY:  [facility]  PHYSICIAN:  Barbaraann Share, MD,FCCPDATE OF BIRTH:  Dec 30[birth year] 1951  DATE OF STUDY:  01/08/2012                           NOCTURNAL POLYSOMNOGRAM  REFERRING PHYSICIAN:  DOUGLAS Heber Angleton, MD  LOCATION:  Sleep Lab.  REFERRING PHYSICIAN:  Veto Kemps, MD  INDICATION FOR STUDY:  Hypersomnia with sleep apnea.  EPWORTH SLEEPINESS SCORE:  2.  SLEEP ARCHITECTURE:  The patient had a total sleep time of only 152 minutes with no slow-wave sleep or REM.  Sleep onset latency was prolonged at 74 minutes and sleep efficiency was extremely poor at 40%.  RESPIRATORY DATA:  The patient was found to have no apneas and no obstructive hypopneas, giving him an apnea/hypopnea index of 0.  There was mild to moderate snoring noted.  OXYGEN DATA:  The patient had O2 desaturation as low as 85% during the night, and was ultimately started on 1 L of supplemental oxygen by nasal cannula at 2 a.m.  For the whole night, both with and without oxygen, the patient only spent 13 minutes less than 88%.  CARDIAC DATA:  Occasional PVC noted, but no clinically significant arrhythmias were seen.  MOVEMENT/PARASOMNIA:  The patient had no significant leg jerks or other abnormal behaviors noted.  IMPRESSION/RECOMMENDATION: 1. No obstructive events were noted during the night, however, the     patient did have mild-to-moderate snoring.  It should be noted he     only had 152 minutes of total sleep time the entire night, and had     no significant supine sleep.  This may underestimate his degree of     sleep apnea.  If the patient is strongly felt to have clinically     significant sleep-disordered breathing, a home sleep test could be     done in his own environment with the hope that he would have a     longer total sleep time  and possibly a more accurate reflection of     his sleep.  Clinical correlation is suggested. 2. The patient had oxygen desaturation as low as 85% during the night,     and was started on 1 L of oxygen at 2 a.m. by sleep center     protocol.  I am unable to separate out how much time he spent less     than 88% before the oxygen and subsequently after the oxygen.     During the entire night, he spent only 13 minutes less than 88%.     Consideration could be given to over night oximetry on room air.     3.  Occasional premature ventricular contraction noted, but no     clinically significant arrhythmias were seen.     Barbaraann Share, MD,FCCP Diplomate, American Board of Sleep Medicine    [facility]/MEDQ  D:  01/14/2012 16:02:21  T:  01/14/2012 23:31:55  Job:  [id number]

## 2012-01-18 ENCOUNTER — Telehealth: Payer: Self-pay | Admitting: Pulmonary Disease

## 2012-01-18 NOTE — Telephone Encounter (Signed)
Spoke with patient, patient had sleep study done 01/08/12 and is now requesting results per Dr. Kendrick Fries.  Dr. Kendrick Fries please advise, thank you

## 2012-01-18 NOTE — Telephone Encounter (Signed)
Spoke with pt and notified that BQ has not seen the results yet, I will give to him on 01/30/11 and then will call with results.  Dr. Kendrick Fries, please advise results once reviewed

## 2012-01-18 NOTE — Telephone Encounter (Signed)
Pt states he has been waiting all day for his sleep study results.  Pt would like to speak w/ someone who is able to give his results to him, please.  Antionette Fairy

## 2012-01-19 ENCOUNTER — Telehealth: Payer: Self-pay | Admitting: Pulmonary Disease

## 2012-01-19 DIAGNOSIS — R5383 Other fatigue: Secondary | ICD-10-CM

## 2012-01-19 NOTE — Telephone Encounter (Signed)
Spoke with patient and informed him of results as listed below per Dr. Kendrick Fries.  Patient would like to go ahead and set up for home sleep study (does not want to wait until next OV).  Order has been placed, patient aware someone will be getting in contact with him to schedule.  Nothing further needed at this time  Will forward to Dr. Kendrick Fries so that he is also aware.

## 2012-01-19 NOTE — Telephone Encounter (Signed)
Call Documentation     Lazarus Salines, RN 01/19/2012 10:43 AM Signed  Spoke with patient and informed him of results as listed below per Dr. Kendrick Fries. Patient would like to go ahead and set up for home sleep study (does not want to wait until next OV). Order has been placed, patient aware someone will be getting in contact with him to schedule. Nothing further needed at this time  Will forward to Dr. Kendrick Fries so that he is also aware.

## 2012-01-19 NOTE — Telephone Encounter (Signed)
L,  Per the sleep study results he needs to be on 2L O2 at night only.  The results say that he does not have obstructive sleep apnea, but it wasn't the best test so Dr. Shelle Iron recommended a home sleep study.  If he wants we can set up a home sleep study before the next visit (I think this is a good idea).  If he wants to wait to talk to me about it on the next visit that is fine too.  B

## 2012-01-22 ENCOUNTER — Encounter (HOSPITAL_BASED_OUTPATIENT_CLINIC_OR_DEPARTMENT_OTHER): Payer: Self-pay

## 2012-01-22 NOTE — Telephone Encounter (Signed)
Pt wants to cancel home sleep study can be reached at [phone].Larry Walters

## 2012-01-23 NOTE — Telephone Encounter (Signed)
noted 

## 2012-01-24 HISTORY — PX: COLONOSCOPY: SHX5424

## 2012-01-25 NOTE — Telephone Encounter (Signed)
I spoke with the patient and he states that he cannot afford to have home sleep study at this time and requests it be cancelled. I spoke with Bjorn Loser and she will cancel this for the pt. Carron Curie, CMA

## 2012-02-08 ENCOUNTER — Telehealth: Payer: Self-pay | Admitting: Pulmonary Disease

## 2012-02-08 NOTE — Telephone Encounter (Signed)
Dr. Kendrick Fries prescribed the pt chantix back in March 2013, but the pt never started this med due to concerns about side effects. The pt is now wanting to try this medication and is asking for new rx be sent to [facility]. Dr. Kendrick Fries please advise if ok to send in rx for this again. Just wanted to double check if ok to prescribe again due to mention of increased depression in the patient since the last time med was prescribed.  Thanks. Carron Curie, CMA

## 2012-02-08 NOTE — Telephone Encounter (Signed)
My note from 03/2011 says that he did not want Chantix, and I can't find any phone notes where we wrote that Rx.  He has a LOT of mental health issues.  I really don't think that is safe for him.  I'm happy to do Wellbutrin if it is OK by his psychiatrist.

## 2012-02-09 ENCOUNTER — Ambulatory Visit (INDEPENDENT_AMBULATORY_CARE_PROVIDER_SITE_OTHER): Payer: No Typology Code available for payment source | Admitting: Cardiovascular Disease

## 2012-02-09 ENCOUNTER — Encounter: Payer: Self-pay | Admitting: Cardiovascular Disease

## 2012-02-09 VITALS — BP 160/70 | HR 85 | Ht 65.0 in | Wt 138.2 lb

## 2012-02-09 DIAGNOSIS — F17200 Nicotine dependence, unspecified, uncomplicated: Secondary | ICD-10-CM

## 2012-02-09 DIAGNOSIS — I2510 Atherosclerotic heart disease of native coronary artery without angina pectoris: Secondary | ICD-10-CM

## 2012-02-09 DIAGNOSIS — Z01810 Encounter for preprocedural cardiovascular examination: Secondary | ICD-10-CM | POA: Insufficient documentation

## 2012-02-09 DIAGNOSIS — R0602 Shortness of breath: Secondary | ICD-10-CM

## 2012-02-09 DIAGNOSIS — Z720 Tobacco use: Secondary | ICD-10-CM

## 2012-02-09 DIAGNOSIS — I739 Peripheral vascular disease, unspecified: Secondary | ICD-10-CM

## 2012-02-09 DIAGNOSIS — R079 Chest pain, unspecified: Secondary | ICD-10-CM

## 2012-02-09 NOTE — Assessment & Plan Note (Signed)
The patient seems to have symptomatic gallstones. His cardiac status has been stable. He is not to have one-vessel coronary artery disease with good left-to-right collaterals in his ejection fraction is normal. Thus, from a cardiac standpoint he is considered a moderate risk for cardiovascular complications. I will forward this to Dr. Willaim Bane. We will also have to get the opinion of Dr. Kendrick Fries regarding his risk from a pulmonary standpoint.

## 2012-02-09 NOTE — Assessment & Plan Note (Signed)
He asked if he could use Chantix. However, I advised against that due to his history of  anxiety and depression. Continue with nicotine replacement therapy.

## 2012-02-09 NOTE — Patient Instructions (Addendum)
Continue same medications.  Follow up in 3 months.  

## 2012-02-09 NOTE — Progress Notes (Signed)
HPI  This is a 62 year old man who is here today for a followup visit. The patient has extensive medical problems. He has severe COPD. 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 He had endovascular repair done in January of 2013 by Dr. dew. The patient also had significant exertional dyspnea and tachycardia. He underwent a pharmacologic nuclear stress test which showed no evidence of ischemia with normal ejection fraction. There was possibility of a previous inferior apical infarct which was relatively small to moderate in size. He had an echocardiogram done which was overall unremarkable.  He also had significant right lower extremity claudication. Thus, I performed both cardiac catheterization, abdominal aortogram and lower extremity runoff at the same time. Cardiac catheterization showed an occluded RCA with good left to right collaterals and moderate 50% stenosis in the proximal LAD. Peripheral angiography showed patent aortoiliac stents with significant 80% stenosis in the right common iliac artery distal to the previously placed stent. I thus placed a self-expanding stent and overlapped in with the previously placed stent without complications. His right lower extremity claudication resolved.  He has sinus tachycardia related to COPD which improved significantly with the addition of diltiazem. Recently, he had increased abdominal pain and nausea. He underwent an ultrasound which according to him showed gallstones. He saw Dr. Meredith Mody at [facility] surgery but there was a concern about his surgical risk related to his cardiopulmonary status. The patient had more abdominal pain this week but he thinks the symptoms are improving. He reports chronic sharp chest pain a chest which does not happen with physical activities.  Allergies  Allergen Reactions  . Citalopram     Celexa= "felt drunk"  . Mirtazapine     Remeron= "felt drunk:  Marland Kitchen Sertraline Hcl    Zoloft= "felt drunk"  . Penicillins Hives and Rash     Current Outpatient Prescriptions on File Prior to Visit  Medication Sig Dispense Refill  . albuterol (PROVENTIL) (2.5 MG/3ML) 0.083% nebulizer solution Take 2.5 mg by nebulization every 6 (six) hours as needed.       Marland Kitchen atorvastatin (LIPITOR) 20 MG tablet Take 1 tablet (20 mg total) by mouth daily.  30 tablet  6  . clopidogrel (PLAVIX) 75 MG tablet Take 1 tablet (75 mg total) by mouth daily.  30 tablet  6  . diltiazem (CARDIZEM CD) 120 MG 24 hr capsule Take 120 mg by mouth daily.      . fluticasone (FLONASE) 50 MCG/ACT nasal spray Place 1 spray into the nose daily.  16 g  3  . Fluticasone-Salmeterol (ADVAIR DISKUS) 250-50 MCG/DOSE AEPB Inhale 1 puff into the lungs every 12 (twelve) hours.      . roflumilast (DALIRESP) 500 MCG TABS tablet Take 1 tablet (500 mcg total) by mouth daily.  90 tablet  1  . tiotropium (SPIRIVA) 18 MCG inhalation capsule Place 18 mcg into inhaler and inhale daily.      . traMADol (ULTRAM) 50 MG tablet Take 50 mg by mouth every 6 (six) hours as needed.         Past Medical History  Diagnosis Date  . COPD (chronic obstructive pulmonary disease)   . Mold exposure   . Tobacco dependency   . Anxiety   . Hypercholesteremia   . COPD (chronic obstructive pulmonary disease)   . Hypertension   . Peripheral vascular disease     Angiography in August of 2013 showed patent aortoiliac stent graft with no  significant leaks, 80% stenosis in the right common iliac artery just distal to the stent. He had a successful self-expanding stent placement.  . History of multiple pulmonary nodules   . Depression   . AAA (abdominal aortic aneurysm)     s/p endovascular repair by Dr. Wyn Quaker in 01/2011  . Coronary artery disease 08/2011    Cardiac cath: Occluded RCA with good left-to-right collaterals, 50% proximal LAD stenosis.  . Gallstones      Past Surgical History  Procedure Date  . Cataract extraction 12/20/2010    left  eye  . Vein surgery 02/10/2011  . Resection of fatty tumor     back  . Angioplasty / stenting iliac 2013  . Cardiac catheterization 09/13/2011    [facility] iliac artery stent placement  . Abdominal aortic aneurysm repair     Stent graft done by Dr. dew.     Family History  Problem Relation Age of Onset  . COPD Mother   . Aneurysm Father      History   Social History  . Marital Status: Married    Spouse Name: N/A    Number of Children: 1  . Years of Education: N/A   Occupational History  . Maintenence work    Social History Main Topics  . Smoking status: Current Every Day Smoker -- 0.3 packs/day for 35 years    Types: Cigarettes  . Smokeless tobacco: Never Used  . Alcohol Use: No  . Drug Use: No  . Sexually Active: Not on file   Other Topics Concern  . Not on file   Social History Narrative  . No narrative on file     PHYSICAL EXAM   BP 160/70  Pulse 85  Ht 5\' 5"  (1.651 m)  Wt 138 lb 4 oz (62.71 kg)  BMI 23.01 kg/m2  SpO2 98% Constitutional: He is oriented to person, place, and time. He appears well-developed and well-nourished. No distress.  HENT: No nasal discharge.  Head: Normocephalic and atraumatic.  Eyes: Pupils are equal and round. Right eye exhibits no discharge. Left eye exhibits no discharge.  Neck: Normal range of motion. Neck supple. No JVD present. No thyromegaly present.  Cardiovascular: Normal rate, regular rhythm, normal heart sounds and. Exam reveals no gallop and no friction rub. No murmur heard.  Pulmonary/Chest: Effort normal and diminished breath sounds. No stridor. No respiratory distress. He has no wheezes. He has no rales. He exhibits no tenderness.  Abdominal: Soft. Bowel sounds are normal. He exhibits no distension. There is no tenderness. There is no rebound and no guarding.  Musculoskeletal: Normal range of motion. He exhibits no edema and no tenderness.  Neurological: He is alert and oriented to person, place, and time.  Coordination normal.  Skin: Skin is warm and dry. No rash noted. He is not diaphoretic. No erythema. No pallor.  Psychiatric: He has a normal mood and affect. His behavior is normal. Judgment and thought content normal.  Right groin:The femoral pulse is  normal. Distal pulses are also palpable     EKG: Sinus  Rhythm  WITHIN NORMAL LIMITS   ASSESSMENT AND PLAN

## 2012-02-09 NOTE — Telephone Encounter (Signed)
Pt notified of Dr Ulyses Jarred recommendations.  Pt reports that he does not have a psychiatrist.  Pt reports that he will contact his primary md for rx.

## 2012-02-09 NOTE — Assessment & Plan Note (Signed)
The patient is overall stable from a cardiac standpoint. He has chronic atypical chest pain which does not seem to be angina. ECG does not show any acute changes. Continue medical therapy.

## 2012-02-09 NOTE — Assessment & Plan Note (Signed)
His claudication resolved after recent placement of a self-expanding stent in the right common iliac artery. Post procedure ABI was normal bilaterally. Continue Plavix daily. He will need duplex ultrasound in March

## 2012-02-19 ENCOUNTER — Encounter: Payer: Self-pay | Admitting: Pulmonary Disease

## 2012-02-19 ENCOUNTER — Ambulatory Visit (INDEPENDENT_AMBULATORY_CARE_PROVIDER_SITE_OTHER): Payer: No Typology Code available for payment source | Admitting: Pulmonary Disease

## 2012-02-19 ENCOUNTER — Telehealth: Payer: Self-pay | Admitting: Pulmonary Disease

## 2012-02-19 ENCOUNTER — Ambulatory Visit (INDEPENDENT_AMBULATORY_CARE_PROVIDER_SITE_OTHER)
Admission: RE | Admit: 2012-02-19 | Discharge: 2012-02-19 | Disposition: A | Discharge status: Home / Self Care | Payer: No Typology Code available for payment source | Source: Ambulatory Visit | Attending: Pulmonary Disease | Admitting: Pulmonary Disease

## 2012-02-19 ENCOUNTER — Other Ambulatory Visit: Payer: Self-pay

## 2012-02-19 VITALS — BP 122/66 | HR 80 | Temp 97.5°F | Ht 65.0 in | Wt 136.0 lb

## 2012-02-19 DIAGNOSIS — R05 Cough: Secondary | ICD-10-CM

## 2012-02-19 DIAGNOSIS — F17200 Nicotine dependence, unspecified, uncomplicated: Secondary | ICD-10-CM

## 2012-02-19 DIAGNOSIS — J449 Chronic obstructive pulmonary disease, unspecified: Secondary | ICD-10-CM

## 2012-02-19 DIAGNOSIS — R0602 Shortness of breath: Secondary | ICD-10-CM

## 2012-02-19 DIAGNOSIS — Z720 Tobacco use: Secondary | ICD-10-CM

## 2012-02-19 MED ORDER — ATORVASTATIN CALCIUM 20 MG PO TABS: 20.0000 mg | ORAL_TABLET | Freq: Every day | ORAL | Status: DC

## 2012-02-19 MED ORDER — FORMOTEROL FUMARATE 20 MCG/2ML IN NEBU: 20.0000 ug | INHALATION_SOLUTION | Freq: Two times a day (BID) | RESPIRATORY_TRACT | Status: DC

## 2012-02-19 MED ORDER — LEVOFLOXACIN 500 MG PO TABS: 500.0000 mg | ORAL_TABLET | Freq: Every day | ORAL | Status: AC

## 2012-02-19 MED ORDER — PREDNISONE 20 MG PO TABS: ORAL_TABLET | ORAL | Status: DC

## 2012-02-19 MED ORDER — BUDESONIDE 0.25 MG/2ML IN SUSP: 0.2500 mg | Freq: Two times a day (BID) | RESPIRATORY_TRACT | Status: DC

## 2012-02-19 NOTE — Telephone Encounter (Signed)
Refill sent for atorvastatin. 

## 2012-02-19 NOTE — Telephone Encounter (Signed)
Spouse called bavk re: same. Says pt's rx is "messed up" and this needs to be corrected right away". Larry Walters

## 2012-02-19 NOTE — Progress Notes (Signed)
Subjective:    Patient ID: Larry Walters, male    DOB: 12/14/1950, 62 y.o.   MRN: [id number]  Synopsis: Mr. Muhammed was first evaluated by the [facility] in the Spring of 2013 for COPD.  He has smoked 1/3 ppd for 10.5 years and continues to smoke.  GOLD Grade D.  He was hospitalized for a flare in June 2013 at [facility]  HPI  07/03/11 ROV -- Since the last visit Mr. Lampton was hospitalized at [facility] for a COPD flare.  He stayed in the hospital for four days and was treated with prednisone and antibiotics.  His breathing difficulty and wheezing have improved since then.  He continues to smoke.  He has been using his Advair twice per day.  He does not have significant chest pain, but notes some chest pressure with exertion.  He uses oxygen with exertion regularly.  He is awaiting pulmonary rehab on July 16th.  He has been having a lot of depression symptoms recently as well as anxiety.  He states that a lot of his depression is based in his chronic everyday dyspnea.  He is not ready to quit smoking.  07/31/11 ROV -- oxygen level up above 95% most of the time;  Likes roflumilast, it is helping quite a bit, cough improved, less congestion, shortness of breath improved,   10/24/11 ROV -- Jencarlo returns to clinic today stating that he was recently treated with doxycycline about 2 weeks ago for an episode of bronchitis. He states his sputum production has decreased and overall he has improved somewhat but he is still feeling more short of breath than baseline he has noticed that he's been wheezing more lately he was not she'd with prednisone. He's also noticed that when he goes outside he started to develop a lot of nasal congestion and stuffiness. This makes a lot harder for him to use his oxygen. He denies fevers chills cough or chest pain. He is been participating in pulmonary rehabilitation.  12/26/2011 ROV -- Kemon has been doing well since her last visit. His is improved in his cough  is at baseline. He was participating with pulmonary rehabilitation and he started to have abdominal pain and so he stopped. He's been found to have gallstones and there has been consideration made for cholecystectomy. Right now plans for that are on hold due to his Plavix. He still has some shortness of breath with exertion such as climbing a ladder or cleaning out his gutters. In general he is in much better spirits today than his previous visits and he seems to be more active. Lately he's been feeling more fatigued during the day, and has been taking a lot of naps. His wife says that he snores heavily and he notes that he frequently wakes up around 3 or 4 AM for unclear reasons.  02/19/2011 ROV -- Since December 2013 Arlester has been feeling bad. He notes that in the last week he's had increased chest congestion, yellow phlegm, and shortness of breath. This is also associated with watery sinus congestion. He has not had fevers or chills. He is experiencing some right upper quadrant pain which is consistent with his known gallstone disease. He has had some chest tightness and minimal wheezing. He states that when he is doing something "hard" or walking around in cold air this makes him cough. He continues to use his Spriva, Advair, and Roflumilast. He is using an albuterol inhaler and he says he doesn't even feel it when he  takes it. When he uses the albuterol nebulizers he feels more relief of shortness of breath. He is using his oxygen at home when he exerts himself. He is continuing to smoke cigarettes.  Past Medical History  Diagnosis Date  . COPD (chronic obstructive pulmonary disease)   . Mold exposure   . Tobacco dependency   . Anxiety   . Hypercholesteremia   . COPD (chronic obstructive pulmonary disease)   . Hypertension   . Peripheral vascular disease     Angiography in August of 2013 showed patent aortoiliac stent graft with no significant leaks, 80% stenosis in the right common iliac  artery just distal to the stent. He had a successful self-expanding stent placement.  . History of multiple pulmonary nodules   . Depression   . AAA (abdominal aortic aneurysm)     s/p endovascular repair by Dr. Wyn Quaker in 01/2011  . Coronary artery disease 08/2011    Cardiac cath: Occluded RCA with good left-to-right collaterals, 50% proximal LAD stenosis.  . Gallstones      Review of Systems  Constitutional: Positive for fatigue. Negative for fever and chills.  HENT: Negative for congestion, rhinorrhea, sneezing and postnasal drip.   Respiratory: Positive for shortness of breath. Negative for cough and wheezing.   Cardiovascular: Negative for chest pain and leg swelling.       Objective:   Physical Exam   Filed Vitals:   02/19/12 1111  BP: 122/66  Pulse: 80  Temp: 97.5 F (36.4 C)  TempSrc: Oral  Height: 5\' 5"  (1.651 m)  Weight: 136 lb (61.689 kg)  SpO2: 92%   Gen: chronically ill appearing, no acute distress HEENT: NCAT,EOMi, OP clear PULM: wheezing bilaterally CV: RRR, no mgr, no JVD AB: BS+, soft, nontender, no hsm Ext: warm, no edema, no clubbing, no cyanosis  04/2010 PFT from [facility]: FEV1 34% predicted, DLCO 44% predicted     Assessment & Plan:   COPD (chronic obstructive pulmonary disease) Oluwaseun has another flare of his COPD which is due primarily to his ongoing tobacco use.  His HFA technique was reviewed today and was good, but it may be reasonable to try to switch him to another delivery device for the Advair.    Plan: -CXR to rule out pneumonia (I doubt he has pneumonia, but has crackles, purulent sputum so will check). -treat flare with prednisone and levaquin -continue spiriva -flu shot utd -stop advair -start perforomist and pulmicort -continue roflumilast  -see tobacco abuse  Tobacco abuse Treating Walker's COPD and recurrent flares is starting to seem like an exercise in futility considering his ongoing tobacco use.  We  discussed treatment options at length today.  Given the severity of his depression and anxiety, I do not think that Chantix is a good idea for him.  He is not interested in Wellbutrin.  He wants to try to quit cold [place].  I encouraged him to reconsider Wellbutrin or nicotine patches.  We discussed this for more than ten minutes.     Updated Medication List Outpatient Encounter Prescriptions as of 02/19/2012  Medication Sig Dispense Refill  . albuterol (PROVENTIL) (2.5 MG/3ML) 0.083% nebulizer solution Take 2.5 mg by nebulization every 6 (six) hours as needed.       . ALPRAZolam (XANAX) 0.5 MG tablet Take 0.5 mg by mouth at bedtime as needed.      Marland Kitchen alprazolam (XANAX) 2 MG tablet Take 2 mg by mouth at bedtime as needed.      Marland Kitchen atorvastatin (  LIPITOR) 20 MG tablet Take 1 tablet (20 mg total) by mouth daily.  30 tablet  6  . clopidogrel (PLAVIX) 75 MG tablet Take 1 tablet (75 mg total) by mouth daily.  30 tablet  6  . diltiazem (CARDIZEM CD) 120 MG 24 hr capsule Take 120 mg by mouth daily.      . fluticasone (FLONASE) 50 MCG/ACT nasal spray Place 1 spray into the nose daily.  16 g  3  . Fluticasone-Salmeterol (ADVAIR DISKUS) 250-50 MCG/DOSE AEPB Inhale 1 puff into the lungs every 12 (twelve) hours.      . roflumilast (DALIRESP) 500 MCG TABS tablet Take 1 tablet (500 mcg total) by mouth daily.  90 tablet  1  . tiotropium (SPIRIVA) 18 MCG inhalation capsule Place 18 mcg into inhaler and inhale daily.      . traMADol (ULTRAM) 50 MG tablet Take 50 mg by mouth every 6 (six) hours as needed.      . [DISCONTINUED] nicotine (NICODERM CQ - DOSED IN MG/24 HOURS) 14 mg/24hr patch Place 1 patch onto the skin daily.      . [DISCONTINUED] nicotine (NICODERM CQ - DOSED IN MG/24 HR) 7 mg/24hr patch Place 1 patch onto the skin daily.

## 2012-02-19 NOTE — Assessment & Plan Note (Signed)
Darian has another flare of his COPD which is due primarily to his ongoing tobacco use.  His HFA technique was reviewed today and was good, but it may be reasonable to try to switch him to another delivery device for the Advair.    Plan: -CXR to rule out pneumonia (I doubt he has pneumonia, but has crackles, purulent sputum so will check). -treat flare with prednisone and levaquin -continue spiriva -flu shot utd -stop advair -start perforomist and pulmicort -continue roflumilast  -see tobacco abuse

## 2012-02-19 NOTE — Patient Instructions (Signed)
Stop taking the Advair Start taking the perforomist and pulmicort in your nebulizer twice a day no matter how you feel Keep taking all the other medications as written QUIT SMOKING, let us know if you reconsider taking Wellbutrin for smoking Take the prednisone and the Levaquin as you are doing Go get your Chest X-ray today, we will call you with the results.  We will see you back in 2 months or sooner if needed

## 2012-02-19 NOTE — Telephone Encounter (Signed)
Pt was confused about pred taper instructions so I reviewed instructions with pt and wife. Carron Curie, CMA

## 2012-02-19 NOTE — Assessment & Plan Note (Addendum)
Treating Larry Walters's COPD and recurrent flares is starting to seem like an exercise in futility considering his ongoing tobacco use.  We discussed treatment options at length today.  Given the severity of his depression and anxiety, I do not think that Chantix is a good idea for him.  He is not interested in Wellbutrin.  He wants to try to quit cold [place].  I encouraged him to reconsider Wellbutrin or nicotine patches.  We discussed this for more than ten minutes.

## 2012-02-20 ENCOUNTER — Telehealth: Payer: Self-pay | Admitting: Pulmonary Disease

## 2012-02-20 NOTE — Telephone Encounter (Signed)
Yes, that is fine.  Do I need to re-prescribe anything?

## 2012-02-20 NOTE — Telephone Encounter (Signed)
I spoke with the pt and he states that the perforomists cost $120 a month an he cannot afford this. He wants to know cna he be switched back to advair and stop the neb meds. Please advise.Carron Curie, CMA

## 2012-02-20 NOTE — Telephone Encounter (Signed)
Pt advised and he states he has some advair at home so does not need a new RX. Carron Curie, CMA

## 2012-02-28 ENCOUNTER — Ambulatory Visit: Payer: Self-pay | Admitting: Specialist

## 2012-03-21 ENCOUNTER — Telehealth: Payer: Self-pay | Admitting: Pulmonary Disease

## 2012-03-21 NOTE — Telephone Encounter (Signed)
Called pt to schd follow up apt with McQuaid. Pt refused and says he does not want to see him anymore.

## 2012-03-25 ENCOUNTER — Other Ambulatory Visit: Payer: Self-pay

## 2012-03-25 MED ORDER — DILTIAZEM HCL ER COATED BEADS 120 MG PO CP24: 120.0000 mg | ORAL_CAPSULE | Freq: Every day | ORAL | Status: DC

## 2012-03-25 NOTE — Telephone Encounter (Signed)
Refill sent to [facility] in [place] for diltiazem 120 mg take one tablet daily.

## 2012-03-28 ENCOUNTER — Other Ambulatory Visit: Payer: Self-pay

## 2012-03-28 ENCOUNTER — Encounter (INDEPENDENT_AMBULATORY_CARE_PROVIDER_SITE_OTHER): Payer: No Typology Code available for payment source

## 2012-03-28 DIAGNOSIS — I739 Peripheral vascular disease, unspecified: Secondary | ICD-10-CM

## 2012-03-28 MED ORDER — CLOPIDOGREL BISULFATE 75 MG PO TABS: 75.0000 mg | ORAL_TABLET | Freq: Every day | ORAL | Status: DC

## 2012-03-28 MED ORDER — DILTIAZEM HCL ER COATED BEADS 120 MG PO CP24: 120.0000 mg | ORAL_CAPSULE | Freq: Every day | ORAL | Status: DC

## 2012-03-28 NOTE — Telephone Encounter (Signed)
Refill sent for 90 day supply for diltiazem.

## 2012-03-28 NOTE — Telephone Encounter (Signed)
Refill sent for plavix  

## 2012-04-26 ENCOUNTER — Telehealth: Payer: Self-pay | Admitting: Pulmonary Disease

## 2012-04-26 NOTE — Telephone Encounter (Signed)
Dr. Imogene Burn who is physiatrist saw the pt yesterday.  Pt is currently on Daliresp for COPD. He is having suicidal thoughts along with depression. Dr. Imogene Burn is wondering if Lenice Llamas is making his depression worse.  Is there a possibility that we could change the medication or d/c it?  Please advise Dr. Kendrick Fries. Thanks.

## 2012-04-29 NOTE — Telephone Encounter (Signed)
Ok to d/c daliresp and see what difference it makes because no other way to tell for sure

## 2012-04-29 NOTE — Telephone Encounter (Signed)
I called and spoke with Dr Imogene Burn and notified of recs per MW He verbalized understanding I called and let the pt know and he verbalized understanding Will close and forward to Dr Kendrick Fries so that he is aware

## 2012-04-29 NOTE — Telephone Encounter (Signed)
Due to the issues the pt is having with depression and suicidal thoughts, and Dr. Corey Skains unavailability I am going to send this message to Doc of the day. Dr. Sherene Sires  Please advise if daliresp could be making the pt depression worse? SHoudl this medication be stopped? Please advise. Carron Curie, CMA Allergies  Allergen Reactions  . Citalopram     Celexa= "felt drunk"  . Mirtazapine     Remeron= "felt drunk:  Marland Kitchen Sertraline Hcl     Zoloft= "felt drunk"  . Penicillins Hives and Rash

## 2012-05-01 ENCOUNTER — Encounter (INDEPENDENT_AMBULATORY_CARE_PROVIDER_SITE_OTHER): Payer: No Typology Code available for payment source

## 2012-05-01 DIAGNOSIS — I700 Atherosclerosis of aorta: Secondary | ICD-10-CM

## 2012-05-01 DIAGNOSIS — I739 Peripheral vascular disease, unspecified: Secondary | ICD-10-CM

## 2012-05-07 ENCOUNTER — Encounter: Payer: Self-pay | Admitting: Specialist

## 2012-05-14 ENCOUNTER — Other Ambulatory Visit: Payer: Self-pay | Admitting: Cardiovascular Disease

## 2012-05-14 ENCOUNTER — Encounter: Payer: Self-pay | Admitting: Cardiovascular Disease

## 2012-05-14 ENCOUNTER — Ambulatory Visit (INDEPENDENT_AMBULATORY_CARE_PROVIDER_SITE_OTHER): Payer: No Typology Code available for payment source | Admitting: Cardiovascular Disease

## 2012-05-14 VITALS — BP 122/60 | HR 93 | Ht 65.0 in | Wt 136.8 lb

## 2012-05-14 DIAGNOSIS — I2510 Atherosclerotic heart disease of native coronary artery without angina pectoris: Secondary | ICD-10-CM

## 2012-05-14 DIAGNOSIS — R0602 Shortness of breath: Secondary | ICD-10-CM

## 2012-05-14 DIAGNOSIS — I6521 Occlusion and stenosis of right carotid artery: Secondary | ICD-10-CM

## 2012-05-14 DIAGNOSIS — E785 Hyperlipidemia, unspecified: Secondary | ICD-10-CM

## 2012-05-14 DIAGNOSIS — I739 Peripheral vascular disease, unspecified: Secondary | ICD-10-CM

## 2012-05-14 DIAGNOSIS — I6529 Occlusion and stenosis of unspecified carotid artery: Secondary | ICD-10-CM

## 2012-05-14 NOTE — Patient Instructions (Addendum)
Continue same medications.  Follow up in 6 months.  

## 2012-05-14 NOTE — Assessment & Plan Note (Addendum)
He has no claudication. Aortoiliac duplex this month showed patent stents. I plan on keeping him on Plavix indefinitely. He is not on aspirin due to bruising.

## 2012-05-14 NOTE — Assessment & Plan Note (Signed)
Moderate right carotid stenosis which will be monitored. Continue medical therapy. He is due for carotid Doppler in July of 2014.

## 2012-05-14 NOTE — Assessment & Plan Note (Signed)
Due to his extensive atherosclerosis, I recommend lifelong treatment with a statin.

## 2012-05-14 NOTE — Assessment & Plan Note (Signed)
He is overall stable with no symptoms of angina. Continue medical therapy. 

## 2012-05-14 NOTE — Progress Notes (Signed)
HPI  This is a 62 year old man who is here today for a followup visit. The patient has extensive medical problems. He has severe COPD. 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 He had endovascular repair done in January of 2013 by Dr. dew. The patient also had significant exertional dyspnea and tachycardia. He underwent a pharmacologic nuclear stress test last year which showed no evidence of ischemia with normal ejection fraction. There was possibility of a previous inferior apical infarct which was relatively small to moderate in size. He had an echocardiogram done which was overall unremarkable.  He also had significant right lower extremity claudication. Thus, I performed both cardiac catheterization, abdominal aortogram and lower extremity runoff at the same time. Cardiac catheterization showed an occluded RCA with good left to right collaterals and moderate 50% stenosis in the proximal LAD. Peripheral angiography showed patent aortoiliac stents with significant 80% stenosis in the right common iliac artery distal to the previously placed stent. I thus placed a self-expanding stent and overlapped in with the previously placed stent without complications. His right lower extremity claudication resolved.  He has sinus tachycardia related to COPD which improved significantly with the addition of diltiazem. He has been doing reasonably well overall. He denies any chest pain. He has chronic dyspnea related to COPD. He continues to suffer from depression and anxiety. He reports no lower extremity claudication.  Allergies  Allergen Reactions  . Citalopram     Celexa= "felt drunk"  . Mirtazapine     Remeron= "felt drunk:  Marland Kitchen Sertraline Hcl     Zoloft= "felt drunk"  . Penicillins Hives and Rash     Current Outpatient Prescriptions on File Prior to Visit  Medication Sig Dispense Refill  . albuterol (PROVENTIL) (2.5 MG/3ML) 0.083% nebulizer solution Take  2.5 mg by nebulization every 6 (six) hours as needed.       Marland Kitchen atorvastatin (LIPITOR) 20 MG tablet Take 1 tablet (20 mg total) by mouth daily.  30 tablet  6  . clopidogrel (PLAVIX) 75 MG tablet Take 1 tablet (75 mg total) by mouth daily.  90 tablet  3  . diltiazem (CARDIZEM CD) 120 MG 24 hr capsule Take 1 capsule (120 mg total) by mouth daily.  90 capsule  3  . predniSONE (DELTASONE) 20 MG tablet Take 40mg  po daily for 3 days, then take 30mg  po daily for 3 days, then take 20mg  po daily for two days, then take 10mg  po daily for 2 days  20 tablet  0  . roflumilast (DALIRESP) 500 MCG TABS tablet Take 1 tablet (500 mcg total) by mouth daily.  90 tablet  1  . tiotropium (SPIRIVA) 18 MCG inhalation capsule Place 18 mcg into inhaler and inhale daily.       No current facility-administered medications on file prior to visit.     Past Medical History  Diagnosis Date  . COPD (chronic obstructive pulmonary disease)   . Mold exposure   . Tobacco dependency   . Anxiety   . Hypercholesteremia   . COPD (chronic obstructive pulmonary disease)   . Hypertension   . Peripheral vascular disease     Angiography in August of 2013 showed patent aortoiliac stent graft with no significant leaks, 80% stenosis in the right common iliac artery just distal to the stent. He had a successful self-expanding stent placement.  . History of multiple pulmonary nodules   . Depression   . AAA (abdominal aortic aneurysm)  s/p endovascular repair by Dr. Wyn Quaker in 01/2011  . Coronary artery disease 08/2011    Cardiac cath: Occluded RCA with good left-to-right collaterals, 50% proximal LAD stenosis.  . Gallstones      Past Surgical History  Procedure Laterality Date  . Cataract extraction  12/20/2010    left eye  . Vein surgery  02/10/2011  . Resection of fatty tumor      back  . Angioplasty / stenting iliac  2013  . Cardiac catheterization  09/13/2011    [facility] iliac artery stent placement  . Abdominal aortic  aneurysm repair      Stent graft done by Dr. dew.     Family History  Problem Relation Age of Onset  . COPD Mother   . Aneurysm Father      History   Social History  . Marital Status: Married    Spouse Name: N/A    Number of Children: 1  . Years of Education: N/A   Occupational History  . Maintenence work    Social History Main Topics  . Smoking status: Current Every Day Smoker -- 0.30 packs/day for 35 years    Types: Cigarettes  . Smokeless tobacco: Never Used  . Alcohol Use: No  . Drug Use: No  . Sexually Active: Not on file   Other Topics Concern  . Not on file   Social History Narrative  . No narrative on file     PHYSICAL EXAM   BP 122/60  Pulse 93  Ht 5\' 5"  (1.651 m)  Wt 136 lb 12 oz (62.029 kg)  BMI 22.76 kg/m2 Constitutional: He is oriented to person, place, and time. He appears well-developed and well-nourished. No distress.  HENT: No nasal discharge.  Head: Normocephalic and atraumatic.  Eyes: Pupils are equal and round. Right eye exhibits no discharge. Left eye exhibits no discharge.  Neck: Normal range of motion. Neck supple. No JVD present. No thyromegaly present.  Cardiovascular: Normal rate, regular rhythm, normal heart sounds and. Exam reveals no gallop and no friction rub. No murmur heard.  Pulmonary/Chest: Effort normal and diminished breath sounds. No stridor. No respiratory distress. He has no wheezes. He has no rales. He exhibits no tenderness.  Abdominal: Soft. Bowel sounds are normal. He exhibits no distension. There is no tenderness. There is no rebound and no guarding.  Musculoskeletal: Normal range of motion. He exhibits no edema and no tenderness.  Neurological: He is alert and oriented to person, place, and time. Coordination normal.  Skin: Skin is warm and dry. No rash noted. He is not diaphoretic. No erythema. No pallor.  Psychiatric: He has a normal mood and affect. His behavior is normal. Judgment and thought content normal.    Right groin:The femoral pulse is  normal. Distal pulses are also palpable     EKG: Sinus  Rhythm with PVCs. Incomplete right bundle branch block    ASSESSMENT AND PLAN

## 2012-05-17 ENCOUNTER — Telehealth: Payer: Self-pay | Admitting: Pulmonary Disease

## 2012-05-17 MED ORDER — ROFLUMILAST 500 MCG PO TABS: 500.0000 ug | ORAL_TABLET | Freq: Every day | ORAL | Status: AC

## 2012-05-17 NOTE — Telephone Encounter (Signed)
Pt is aware RX has been sent. Nothing further was needed

## 2012-05-21 ENCOUNTER — Ambulatory Visit: Payer: Self-pay | Admitting: Pulmonary Disease

## 2012-05-23 ENCOUNTER — Encounter: Payer: Self-pay | Admitting: Specialist

## 2012-05-23 ENCOUNTER — Telehealth: Payer: Self-pay

## 2012-05-23 NOTE — Telephone Encounter (Signed)
Pt walked in office asking if ok to take wellbutrin and Cymbalta that was recently prescribed by psychiatrist I discussed with Dr. Kirke Corin who says ok to take both. Understanding verb

## 2012-05-24 ENCOUNTER — Emergency Department: Payer: Self-pay | Admitting: Emergency Medicine

## 2012-05-24 ENCOUNTER — Observation Stay (HOSPITAL_COMMUNITY)
Admission: AD | Admit: 2012-05-24 | Discharge: 2012-05-27 | DRG: 419 | Disposition: A | Discharge status: Home / Self Care | Payer: No Typology Code available for payment source | Source: Other Acute Inpatient Hospital | Attending: Internal Medicine | Admitting: Internal Medicine

## 2012-05-24 ENCOUNTER — Encounter (HOSPITAL_COMMUNITY): Payer: Self-pay | Admitting: General Practice

## 2012-05-24 DIAGNOSIS — Z7902 Long term (current) use of antithrombotics/antiplatelets: Secondary | ICD-10-CM | POA: Insufficient documentation

## 2012-05-24 DIAGNOSIS — I252 Old myocardial infarction: Secondary | ICD-10-CM | POA: Insufficient documentation

## 2012-05-24 DIAGNOSIS — F17200 Nicotine dependence, unspecified, uncomplicated: Secondary | ICD-10-CM | POA: Diagnosis present

## 2012-05-24 DIAGNOSIS — K5900 Constipation, unspecified: Secondary | ICD-10-CM | POA: Insufficient documentation

## 2012-05-24 DIAGNOSIS — I10 Essential (primary) hypertension: Secondary | ICD-10-CM

## 2012-05-24 DIAGNOSIS — J449 Chronic obstructive pulmonary disease, unspecified: Secondary | ICD-10-CM

## 2012-05-24 DIAGNOSIS — R110 Nausea: Secondary | ICD-10-CM

## 2012-05-24 DIAGNOSIS — I2510 Atherosclerotic heart disease of native coronary artery without angina pectoris: Secondary | ICD-10-CM

## 2012-05-24 DIAGNOSIS — I739 Peripheral vascular disease, unspecified: Secondary | ICD-10-CM | POA: Insufficient documentation

## 2012-05-24 DIAGNOSIS — R1011 Right upper quadrant pain: Secondary | ICD-10-CM

## 2012-05-24 DIAGNOSIS — Z9889 Other specified postprocedural states: Secondary | ICD-10-CM | POA: Insufficient documentation

## 2012-05-24 DIAGNOSIS — E7800 Pure hypercholesterolemia, unspecified: Secondary | ICD-10-CM | POA: Insufficient documentation

## 2012-05-24 DIAGNOSIS — K8010 Calculus of gallbladder with chronic cholecystitis without obstruction: Secondary | ICD-10-CM

## 2012-05-24 DIAGNOSIS — K810 Acute cholecystitis: Secondary | ICD-10-CM

## 2012-05-24 DIAGNOSIS — F3289 Other specified depressive episodes: Secondary | ICD-10-CM | POA: Insufficient documentation

## 2012-05-24 DIAGNOSIS — J4489 Other specified chronic obstructive pulmonary disease: Secondary | ICD-10-CM | POA: Insufficient documentation

## 2012-05-24 DIAGNOSIS — F329 Major depressive disorder, single episode, unspecified: Secondary | ICD-10-CM | POA: Insufficient documentation

## 2012-05-24 DIAGNOSIS — K8062 Calculus of gallbladder and bile duct with acute cholecystitis without obstruction: Principal | ICD-10-CM | POA: Insufficient documentation

## 2012-05-24 HISTORY — DX: Acute myocardial infarction, unspecified: I21.9

## 2012-05-24 HISTORY — DX: Shortness of breath: R06.02

## 2012-05-24 LAB — COMPREHENSIVE METABOLIC PANEL
Albumin: 3.4 g/dL (ref 3.4–5.0)
Alkaline Phosphatase: 135 U/L (ref 50–136)
Anion Gap: 4 — ABNORMAL LOW (ref 7–16)
Calcium, Total: 8.7 mg/dL (ref 8.5–10.1)
Osmolality: 277 (ref 275–301)
Potassium: 3.9 mmol/L (ref 3.5–5.1)
SGOT(AST): 99 U/L — ABNORMAL HIGH (ref 15–37)
SGPT (ALT): 63 U/L (ref 12–78)
Sodium: 139 mmol/L (ref 136–145)

## 2012-05-24 LAB — URINALYSIS, COMPLETE
Bilirubin,UR: NEGATIVE
Glucose,UR: NEGATIVE mg/dL (ref 0–75)
Ketone: NEGATIVE
Leukocyte Esterase: NEGATIVE
RBC,UR: <1 /HPF (ref 0–5)
Specific Gravity: 1.003 (ref 1.003–1.030)
WBC UR: <1 /HPF (ref 0–5)

## 2012-05-24 LAB — CBC
HCT: 35.2 % — ABNORMAL LOW (ref 40.0–52.0)
HGB: 11.3 g/dL — ABNORMAL LOW (ref 13.0–18.0)
Platelet: 256 10*3/uL (ref 150–440)
WBC: 11.4 10*3/uL — ABNORMAL HIGH (ref 3.8–10.6)

## 2012-05-24 LAB — TROPONIN I: Troponin-I: <0.02 ng/mL

## 2012-05-24 LAB — CK TOTAL AND CKMB (NOT AT ARMC)
CK, Total: 103 U/L (ref 35–232)
CK-MB: 1.1 ng/mL (ref 0.5–3.6)

## 2012-05-24 LAB — LIPASE, BLOOD: Lipase: 329 U/L (ref 73–393)

## 2012-05-24 MED ORDER — CIPROFLOXACIN IN D5W 400 MG/200ML IV SOLN
400.0000 mg | Freq: Two times a day (BID) | INTRAVENOUS | Status: DC
  Administered 2012-05-24 – 2012-05-26 (×4): 400 mg via INTRAVENOUS
  Filled 2012-05-24 (×6): qty 200

## 2012-05-24 MED ORDER — FLUTICASONE PROPIONATE 50 MCG/ACT NA SUSP
1.0000 | Freq: Every day | NASAL | Status: DC | PRN
  Filled 2012-05-24: qty 16

## 2012-05-24 MED ORDER — ACETAMINOPHEN 325 MG PO TABS: 650.0000 mg | ORAL_TABLET | Freq: Four times a day (QID) | ORAL | Status: DC | PRN

## 2012-05-24 MED ORDER — OXYCODONE HCL 5 MG PO TABS
5.0000 mg | ORAL_TABLET | ORAL | Status: DC | PRN
  Administered 2012-05-25 – 2012-05-26 (×4): 5 mg via ORAL
  Filled 2012-05-24 (×4): qty 1

## 2012-05-24 MED ORDER — TIOTROPIUM BROMIDE MONOHYDRATE 18 MCG IN CAPS
18.0000 ug | ORAL_CAPSULE | Freq: Every day | RESPIRATORY_TRACT | Status: DC
  Administered 2012-05-25 – 2012-05-27 (×3): 18 ug via RESPIRATORY_TRACT
  Filled 2012-05-24 (×2): qty 5

## 2012-05-24 MED ORDER — ALBUTEROL SULFATE (5 MG/ML) 0.5% IN NEBU: 2.5000 mg | INHALATION_SOLUTION | RESPIRATORY_TRACT | Status: DC | PRN

## 2012-05-24 MED ORDER — ONDANSETRON HCL 4 MG/2ML IJ SOLN: 4.0000 mg | Freq: Four times a day (QID) | INTRAMUSCULAR | Status: DC | PRN

## 2012-05-24 MED ORDER — MORPHINE SULFATE 2 MG/ML IJ SOLN
2.0000 mg | INTRAMUSCULAR | Status: DC | PRN
  Administered 2012-05-26 (×2): 2 mg via INTRAVENOUS
  Filled 2012-05-24 (×2): qty 1

## 2012-05-24 MED ORDER — ALBUTEROL SULFATE (5 MG/ML) 0.5% IN NEBU
2.5000 mg | INHALATION_SOLUTION | Freq: Four times a day (QID) | RESPIRATORY_TRACT | Status: DC
  Administered 2012-05-24 – 2012-05-25 (×5): 2.5 mg via RESPIRATORY_TRACT
  Filled 2012-05-24 (×6): qty 0.5

## 2012-05-24 MED ORDER — QUETIAPINE FUMARATE 50 MG PO TABS
50.0000 mg | ORAL_TABLET | Freq: Two times a day (BID) | ORAL | Status: DC
  Administered 2012-05-24 – 2012-05-27 (×6): 50 mg via ORAL
  Filled 2012-05-24 (×8): qty 1

## 2012-05-24 MED ORDER — ATORVASTATIN CALCIUM 20 MG PO TABS
20.0000 mg | ORAL_TABLET | Freq: Every day | ORAL | Status: DC
  Administered 2012-05-24 – 2012-05-26 (×3): 20 mg via ORAL
  Filled 2012-05-24 (×4): qty 1

## 2012-05-24 MED ORDER — ROFLUMILAST 500 MCG PO TABS
500.0000 ug | ORAL_TABLET | Freq: Every day | ORAL | Status: DC
  Filled 2012-05-24 (×4): qty 1

## 2012-05-24 MED ORDER — METRONIDAZOLE IN NACL 5-0.79 MG/ML-% IV SOLN
500.0000 mg | Freq: Three times a day (TID) | INTRAVENOUS | Status: DC
  Administered 2012-05-24 – 2012-05-27 (×8): 500 mg via INTRAVENOUS
  Filled 2012-05-24 (×13): qty 100

## 2012-05-24 MED ORDER — ENOXAPARIN SODIUM 40 MG/0.4ML ~~LOC~~ SOLN
40.0000 mg | SUBCUTANEOUS | Status: DC
  Administered 2012-05-25 – 2012-05-26 (×2): 40 mg via SUBCUTANEOUS
  Filled 2012-05-24 (×3): qty 0.4

## 2012-05-24 MED ORDER — BISACODYL 10 MG RE SUPP
10.0000 mg | Freq: Every day | RECTAL | Status: DC | PRN
  Administered 2012-05-25 – 2012-05-27 (×2): 10 mg via RECTAL
  Filled 2012-05-24 (×2): qty 1

## 2012-05-24 MED ORDER — DILTIAZEM HCL ER COATED BEADS 120 MG PO CP24
120.0000 mg | ORAL_CAPSULE | Freq: Every day | ORAL | Status: DC
  Administered 2012-05-24 – 2012-05-27 (×4): 120 mg via ORAL
  Filled 2012-05-24 (×4): qty 1

## 2012-05-24 MED ORDER — ONDANSETRON HCL 4 MG PO TABS: 4.0000 mg | ORAL_TABLET | Freq: Four times a day (QID) | ORAL | Status: DC | PRN

## 2012-05-24 MED ORDER — POTASSIUM CHLORIDE IN NACL 20-0.9 MEQ/L-% IV SOLN
INTRAVENOUS | Status: DC
  Administered 2012-05-24 – 2012-05-26 (×4): via INTRAVENOUS
  Filled 2012-05-24 (×7): qty 1000

## 2012-05-24 MED ORDER — ACETAMINOPHEN 650 MG RE SUPP: 650.0000 mg | Freq: Four times a day (QID) | RECTAL | Status: DC | PRN

## 2012-05-24 NOTE — H&P (Signed)
[facility] History and Physical  Larry Walters NFA:[id number] DOB: [birth year] DOA: 05/24/2012  Referring physician: er PCP: Clydell Hakim, MD  Specialists: Dr. Rayburn Ma  Chief Complaint: abd pain  HPI: Larry Walters is a 62 y.o. male  Who has known gall stones but they had not given him any trouble.  He woke up early this AM with severe abdominal pain and nausea.  He went to the ER at [facility] where he had elevated liver enzymes an U/s that shows cholelithiasis, non mobile stone in the neck of the gallbladder and a + murphy sign.  Surgeons at [facility] thought him to be too high risk as he has COPD (continue smoking) and vascular issues.  Cardiology at [facility] said, he is cleared to be off plavix and is moderate risk for surgery.  [place] spoke with surgeon, Dr. Rayburn Ma who asked hospitalist to admit and he would consult.    Currently patient and family is demanding to eat.  He is also requesting a cigarette.  +nausea, improved abdominal pain No SOB, no CP, no fever, no chills  Review of Systems: all systems reviewed, negative unless stated above    Past Medical History  Diagnosis Date  . COPD (chronic obstructive pulmonary disease)   . Mold exposure   . Tobacco dependency   . Anxiety   . Hypercholesteremia   . COPD (chronic obstructive pulmonary disease)   . Hypertension   . Peripheral vascular disease     Angiography in August of 2013 showed patent aortoiliac stent graft with no significant leaks, 80% stenosis in the right common iliac artery just distal to the stent. He had a successful self-expanding stent placement.  . History of multiple pulmonary nodules   . Depression   . AAA (abdominal aortic aneurysm)     s/p endovascular repair by Dr. Wyn Quaker in 01/2011  . Coronary artery disease 08/2011    Cardiac cath: Occluded RCA with good left-to-right collaterals, 50% proximal LAD stenosis.  . Gallstones   . Myocardial infarction   . Shortness of breath    Past  Surgical History  Procedure Laterality Date  . Cataract extraction  12/20/2010    left eye  . Vein surgery  02/10/2011  . Resection of fatty tumor      back  . Angioplasty / stenting iliac  2013  . Cardiac catheterization  09/13/2011    [facility] iliac artery stent placement  . Abdominal aortic aneurysm repair      Stent graft done by Dr. dew.   Social History:  reports that he has been smoking Cigarettes.  He has a 10.5 pack-year smoking history. He has never used smokeless tobacco. He reports that he does not drink alcohol or use illicit drugs. -does not want nicotine patch   Allergies  Allergen Reactions  . Citalopram     Celexa= "felt drunk"  . Mirtazapine     Remeron= "felt drunk:  Marland Kitchen Sertraline Hcl     Zoloft= "felt drunk"  . Penicillins Hives and Rash    Family History  Problem Relation Age of Onset  . COPD Mother   . Aneurysm Father     Prior to Admission medications   Medication Sig Start Date End Date Taking? Authorizing Provider  albuterol (PROVENTIL) (2.5 MG/3ML) 0.083% nebulizer solution Take 2.5 mg by nebulization every 6 (six) hours as needed.     Historical Provider, MD  atorvastatin (LIPITOR) 20 MG tablet Take 1 tablet (20 mg total) by mouth daily. 02/19/12 02/18/13  Jerolyn Center  Argentina Donovan, MD  cefUROXime (CEFTIN) 500 MG tablet Take 500 mg by mouth 2 (two) times daily.  05/08/12   Historical Provider, MD  clopidogrel (PLAVIX) 75 MG tablet Take 1 tablet (75 mg total) by mouth daily. 03/28/12 03/28/13  Iran Ouch, MD  diltiazem (CARDIZEM CD) 120 MG 24 hr capsule Take 1 capsule (120 mg total) by mouth daily. 03/28/12   Iran Ouch, MD  fluticasone (FLONASE) 50 MCG/ACT nasal spray Place 1 spray into the nose as needed. 10/24/11 10/23/12  Lupita Leash, MD  predniSONE (DELTASONE) 20 MG tablet Take 40mg  po daily for 3 days, then take 30mg  po daily for 3 days, then take 20mg  po daily for two days, then take 10mg  po daily for 2 days 02/19/12   Lupita Leash, MD   PROAIR HFA 108 (90 BASE) MCG/ACT inhaler 1 puff as needed.  05/06/12   Historical Provider, MD  QUEtiapine (SEROQUEL) 25 MG tablet Take 25 mg by mouth 2 (two) times daily.    Historical Provider, MD  roflumilast (DALIRESP) 500 MCG TABS tablet Take 1 tablet (500 mcg total) by mouth daily. 05/17/12   Lupita Leash, MD  tiotropium (SPIRIVA) 18 MCG inhalation capsule Place 18 mcg into inhaler and inhale daily.    Historical Provider, MD   Physical Exam: Filed Vitals:   05/24/12 1030 05/24/12 1100  BP: 145/62   Pulse: 65   Temp: 98.6 F (37 C)   TempSrc: Oral   Resp: 18   Height:  5\' 5"  (1.651 m)  Weight:  61.3 kg (135 lb 2.3 oz)     General:  Pleasant/cooperative  Eyes: wnl  ENT: wnl  Neck: supple  Cardiovascular: rrr  Respiratory: decreased BS, mild exp wheezing  Abdomen: +BS, soft, minimal RUQ pain with palpations  Skin: no rashes or lesions  Musculoskeletal: moves all 4 ext  Neurologic: no focal deficit  Labs on Admission:  Basic Metabolic Panel: No results found for this basename: NA, K, CL, CO2, GLUCOSE, BUN, CREATININE, CALCIUM, MG, PHOS,  in the last 168 hours Liver Function Tests: No results found for this basename: AST, ALT, ALKPHOS, BILITOT, PROT, ALBUMIN,  in the last 168 hours No results found for this basename: LIPASE, AMYLASE,  in the last 168 hours No results found for this basename: AMMONIA,  in the last 168 hours CBC: No results found for this basename: WBC, NEUTROABS, HGB, HCT, MCV, PLT,  in the last 168 hours Cardiac Enzymes: No results found for this basename: CKTOTAL, CKMB, CKMBINDEX, TROPONINI,  in the last 168 hours  BNP (last 3 results) No results found for this basename: PROBNP,  in the last 8760 hours CBG: No results found for this basename: GLUCAP,  in the last 168 hours  Radiological Exams on Admission: No results found.  EKG: Independently reviewed. RBBB  Assessment/Plan Principal Problem:   Cholecystitis, acute Active  Problems:   Tobacco dependency   Hypertension   COPD (chronic obstructive pulmonary disease)   Nausea alone   1. RUQ pain/chololithiasis with mobile stone in duct: surgery to see, plavix on hold- surgery consult pending- asking for food- refusing clears wants a hamburger, moderate risk per cardiology and ok to be off plavix for surgery 2. COPD with continued tobacco abuse- nebs ATC and PRN- encourage cessation- hold on steroids for now- continue home inhalers 3. Nausea- PRN 4. HTN- PRN  Surgery to see  Code Status: full Family Communication: wife at bedside Disposition Plan:   Time spent: 7  min  Marlin Canary [facility] Pager [phone]  If 7PM-7AM, please contact night-coverage [email or web address] Password [email or web address] 05/24/2012, 1:12 PM

## 2012-05-24 NOTE — Consult Note (Signed)
Larry Walters Aug 31, 1950  [id number].   Requesting MD: Dr. Marlin Canary Chief Complaint/Reason for Consult: gallstones/acute cholecystitis HPI:  This is a 62 yo male who had an episode of epigastric RUQ abdominal pain 4 months ago.  He had an ultrasound that revealed gallstones.  Last night he awoke at 0200am with severe abdominal pain in his RUQ.  No nausea/vomiting.  No fevers, chills.  He went to [facility] where he had another ultrasound that revealed a nonmobile gallstone in the neck of the gallbladder.  The surgeons at [facility] that he was too high risk and requested transfer up here to [facility] for further evaluation.  Review of Systems: Please see HPI, otherwise all other systems are negative except he has COPD for which he still smokes and uses his rescue inhaler at least once a day.  Family History  Problem Relation Age of Onset  . COPD Mother   . Aneurysm Father     Past Medical History  Diagnosis Date  . COPD (chronic obstructive pulmonary disease)   . Mold exposure   . Tobacco dependency   . Anxiety   . Hypercholesteremia   . COPD (chronic obstructive pulmonary disease)   . Hypertension   . Peripheral vascular disease     Angiography in August of 2013 showed patent aortoiliac stent graft with no significant leaks, 80% stenosis in the right common iliac artery just distal to the stent. He had a successful self-expanding stent placement.  . History of multiple pulmonary nodules   . Depression   . AAA (abdominal aortic aneurysm)     s/p endovascular repair by Dr. Wyn Quaker in 01/2011  . Coronary artery disease 08/2011    Cardiac cath: Occluded RCA with good left-to-right collaterals, 50% proximal LAD stenosis.  . Gallstones   . Myocardial infarction   . Shortness of breath     Past Surgical History  Procedure Laterality Date  . Cataract extraction  12/20/2010    left eye  . Vein surgery  02/10/2011  . Resection of fatty tumor      back  . Angioplasty / stenting iliac  2013  .  Cardiac catheterization  09/13/2011    [facility] iliac artery stent placement  . Abdominal aortic aneurysm repair      Stent graft done by Dr. dew.    Social History:  reports that he has been smoking Cigarettes.  He has a 10.5 pack-year smoking history. He has never used smokeless tobacco. He reports that he does not drink alcohol or use illicit drugs.  Allergies:  Allergies  Allergen Reactions  . Citalopram     Celexa= "felt drunk"  . Mirtazapine     Remeron= "felt drunk:  Marland Kitchen Sertraline Hcl     Zoloft= "felt drunk"  . Penicillins Hives and Rash    Medications Prior to Admission  Medication Sig Dispense Refill  . albuterol (PROVENTIL) (2.5 MG/3ML) 0.083% nebulizer solution Take 2.5 mg by nebulization every 6 (six) hours as needed.       Marland Kitchen atorvastatin (LIPITOR) 20 MG tablet Take 1 tablet (20 mg total) by mouth daily.  30 tablet  6  . cefUROXime (CEFTIN) 500 MG tablet Take 500 mg by mouth 2 (two) times daily.       . clopidogrel (PLAVIX) 75 MG tablet Take 1 tablet (75 mg total) by mouth daily.  90 tablet  3  . diltiazem (CARDIZEM CD) 120 MG 24 hr capsule Take 1 capsule (120 mg total) by mouth daily.  90  capsule  3  . fluticasone (FLONASE) 50 MCG/ACT nasal spray Place 1 spray into the nose as needed.      . predniSONE (DELTASONE) 20 MG tablet Take 40mg  po daily for 3 days, then take 30mg  po daily for 3 days, then take 20mg  po daily for two days, then take 10mg  po daily for 2 days  20 tablet  0  . PROAIR HFA 108 (90 BASE) MCG/ACT inhaler 1 puff as needed.       Marland Kitchen QUEtiapine (SEROQUEL) 25 MG tablet Take 25 mg by mouth 2 (two) times daily.      . roflumilast (DALIRESP) 500 MCG TABS tablet Take 1 tablet (500 mcg total) by mouth daily.  90 tablet  0  . tiotropium (SPIRIVA) 18 MCG inhalation capsule Place 18 mcg into inhaler and inhale daily.        Blood pressure 145/62, pulse 65, temperature 98.6 F (37 C), temperature source Oral, resp. rate 18, height 5\' 5"  (1.651 m), weight 135 lb  2.3 oz (61.3 kg). Physical Exam: General: WD, WN white male who is laying in bed in NAD HEENT: head is normocephalic, atraumatic.  Sclera are noninjected.  PERRL.  Ears and nose without any masses or lesions.  Mouth is pink and moist Heart: regular, rate, and rhythm.  Normal s1,s2. No obvious murmurs, gallops, or rubs noted.  Palpable radial and pedal pulses bilaterally Lungs: CTAB, no wheezes, rhonchi, or rales noted.  Respiratory effort nonlabored, however his air movement is limited Abd: soft, tender in RUQ with + Murphy's sign, ND, +BS, no masses, hernias, or organomegaly MS: all 4 extremities are symmetrical with no cyanosis, clubbing, or edema. Skin: warm and dry with no masses, lesions, or rashes Psych: A&Ox3 with an appropriate affect.    No results found for this or any previous visit (from the past 48 hour(s)). No results found.     Assessment/Plan 1. Cholelithiasis, early cholecystitis 2. Stage III COPD, still smoking 3. H/o MI 4. AAA 5. HTN 6. PVD 7. CAD  Plan: 1. The patient is on plavix, but his last dose was yesterday.  This has been held today.  He currently just has a stone in the neck of the gallbladder and maybe some early cholecystitis.  The patient is currently "starving."  Primary has given him a diet.  I would prefer clear liquids, but the patient adamantly refuses this, so we will allow a low fat diet.  I have informed him that this may make his pain worse.  I also discussed with the patient the possible risks and complications of the surgery.  In particular, he was very concerned about the possible ventilator risk.  He actually was so concerned about this that he thought about leaving and going home.  Unfortunately, I told him that this is a legitimate risk for anyone, but his risk is somewhat higher given his COPD and continued smoking status.  He has been seen by his cardiologist, Dr. Keturah Barre, who has cleared him for surgery.  I will d/w Dr. Magnus Ivan to pin down a  better idea of timing for surgery, given plavix.  We will follow along with you.  Marnie Fazzino E 05/24/2012, 2:15 PM Pager: [phone]

## 2012-05-24 NOTE — Consult Note (Signed)
I have seen and examined the patient and agree with the assessment and plans. Probable lap chole Sunday   Bindi Klomp A. Magnus Ivan  MD, FACS

## 2012-05-24 NOTE — Progress Notes (Signed)
DR [facility] AND DR. BLACKMAN NOTIFIED OF ARRIVAL

## 2012-05-25 DIAGNOSIS — K810 Acute cholecystitis: Secondary | ICD-10-CM

## 2012-05-25 DIAGNOSIS — J449 Chronic obstructive pulmonary disease, unspecified: Secondary | ICD-10-CM

## 2012-05-25 LAB — COMPREHENSIVE METABOLIC PANEL
Alkaline Phosphatase: 111 U/L (ref 39–117)
BUN: 10 mg/dL (ref 6–23)
GFR calc Af Amer: >90 mL/min (ref >90)
Glucose, Bld: 109 mg/dL — ABNORMAL HIGH (ref 70–99)
Potassium: 3.8 mEq/L (ref 3.5–5.1)
Total Protein: 6 g/dL (ref 6.0–8.3)

## 2012-05-25 LAB — CBC
HCT: 32.7 % — ABNORMAL LOW (ref 39.0–52.0)
Platelets: 218 10*3/uL (ref 150–400)
RDW: 16.5 % — ABNORMAL HIGH (ref 11.5–15.5)
WBC: 8.3 10*3/uL (ref 4.0–10.5)

## 2012-05-25 MED ORDER — ALBUTEROL SULFATE (5 MG/ML) 0.5% IN NEBU
2.5000 mg | INHALATION_SOLUTION | Freq: Four times a day (QID) | RESPIRATORY_TRACT | Status: DC
  Administered 2012-05-26 – 2012-05-27 (×5): 2.5 mg via RESPIRATORY_TRACT
  Filled 2012-05-25 (×6): qty 0.5

## 2012-05-25 MED ORDER — MOMETASONE FURO-FORMOTEROL FUM 100-5 MCG/ACT IN AERO
2.0000 | INHALATION_SPRAY | Freq: Two times a day (BID) | RESPIRATORY_TRACT | Status: DC
  Administered 2012-05-25 – 2012-05-27 (×4): 2 via RESPIRATORY_TRACT
  Filled 2012-05-25: qty 8.8

## 2012-05-25 MED ORDER — QUETIAPINE FUMARATE 50 MG PO TABS: 50.0000 mg | ORAL_TABLET | Freq: Two times a day (BID) | ORAL | Status: DC

## 2012-05-25 NOTE — Progress Notes (Signed)
Cholecystitis, acute  Assessment: Cholelithiasis, cholecystitis,biliary colic  Plan: I have recommended cholecystectomy to be done in next day or two. He is off plavix two days now. He is asx today and reluctant to have surgery. He will think about it today. Will recheck some labs in am and let him make up his mind about surgery. I discussed tisk with surgery and potential risks if he chooses not to have surgery, including possibility of being done as emergency, developing infection etc.   Subjective: No pain today, no nausea, not hungry   Objective: Vital signs in last 24 hours: Temp:  [98.1 F (36.7 C)-98.6 F (37 C)] 98.4 F (36.9 C) (05/03 0434) Pulse Rate:  [65-79] 67 (05/03 0434) Resp:  [15-20] 16 (05/03 0434) BP: (107-151)/(56-62) 131/57 mmHg (05/03 0434) SpO2:  [96 %-100 %] 99 % (05/03 0800) Weight:  [135 lb 2.3 oz (61.3 kg)] 135 lb 2.3 oz (61.3 kg) (05/02 1100)    Intake/Output from previous day:    General appearance: alert, cooperative and no distress GI: soft, non-tender; bowel sounds normal; no masses,  no organomegaly  Lab Results:  Results for orders placed during the hospital encounter of 05/24/12 (from the past 24 hour(s))  COMPREHENSIVE METABOLIC PANEL     Status: Abnormal   Collection Time    05/25/12  4:44 AM      Result Value Range   Sodium 139  135 - 145 mEq/L   Potassium 3.8  3.5 - 5.1 mEq/L   Chloride 105  96 - 112 mEq/L   CO2 26  19 - 32 mEq/L   Glucose, Bld 109 (*) 70 - 99 mg/dL   BUN 10  6 - 23 mg/dL   Creatinine, Ser 1.61  0.50 - 1.35 mg/dL   Calcium 8.0 (*) 8.4 - 10.5 mg/dL   Total Protein 6.0  6.0 - 8.3 g/dL   Albumin 2.8 (*) 3.5 - 5.2 g/dL   AST 29  0 - 37 U/L   ALT 32  0 - 53 U/L   Alkaline Phosphatase 111  39 - 117 U/L   Total Bilirubin 0.2 (*) 0.3 - 1.2 mg/dL   GFR calc non Af Amer >90  >90 mL/min   GFR calc Af Amer >90  >90 mL/min  CBC     Status: Abnormal   Collection Time    05/25/12  4:44 AM      Result Value Range   WBC  8.3  4.0 - 10.5 K/uL   RBC 4.34  4.22 - 5.81 MIL/uL   Hemoglobin 10.2 (*) 13.0 - 17.0 g/dL   HCT 09.6 (*) 04.5 - 40.9 %   MCV 75.3 (*) 78.0 - 100.0 fL   MCH 23.5 (*) 26.0 - 34.0 pg   MCHC 31.2  30.0 - 36.0 g/dL   RDW 81.1 (*) 91.4 - 78.2 %   Platelets 218  150 - 400 K/uL     Studies/Results Radiology     MEDS, Scheduled . albuterol  2.5 mg Nebulization Q6H  . atorvastatin  20 mg Oral q1800  . ciprofloxacin  400 mg Intravenous Q12H  . diltiazem  120 mg Oral Daily  . enoxaparin (LOVENOX) injection  40 mg Subcutaneous Q24H  . metronidazole  500 mg Intravenous Q8H  . mometasone-formoterol  2 puff Inhalation BID  . QUEtiapine  50 mg Oral BID  . roflumilast  500 mcg Oral Daily  . tiotropium  18 mcg Inhalation Daily       LOS: 1 day  Currie Paris, MD, [facility], [place] [phone]   05/25/2012 10:15 AM

## 2012-05-25 NOTE — Progress Notes (Signed)
[facility] PROGRESS NOTE  Larry Walters ZOX:[id number] DOB: [birth year] DOA: 05/24/2012 PCP: Clydell Hakim, MD  Assessment/Plan:  1. RUQ pain/chololithiasis; no worsening pain. Continue with cipro and flagyl. Surgery probably tomorrow. Moderate risk for surgery.  2. COPD ; resume advair. Albuterol.  3. HTN- Continue with Cardizem.  4. CAD: holding plavix for surgery. His cardiologist clear him for surgery. Patient at moderate risk.  5. AAA; S/p repair.  6. Hypercholesterolemia: Continue with Lipitor.    Code Status: Full Family Communication: care discussed with patient.  Disposition Plan: home when stable.    Consultants:  Surgery.   Procedures:  none  Antibiotics:  Cipro 5-2  Flagyl 5-2  HPI/Subjective: No abdominal pain. Tolerating diet. Now he is constipated.  He denies SOB or chest pain n exertion.    Objective: Filed Vitals:   05/24/12 1752 05/24/12 2206 05/25/12 0434 05/25/12 0800  BP: 120/57 107/59 131/57   Pulse: 67 79 67   Temp: 98.5 F (36.9 C) 98.1 F (36.7 C) 98.4 F (36.9 C)   TempSrc: Oral Oral Oral   Resp: 15 16 16    Height:      Weight:      SpO2: 100% 100% 100% 99%    Intake/Output Summary (Last 24 hours) at 05/25/12 1018 Last data filed at 05/25/12 0900  Gross per 24 hour  Intake    118 ml  Output      0 ml  Net    118 ml   Filed Weights   05/24/12 1100  Weight: 61.3 kg (135 lb 2.3 oz)    Exam:   General:  No distress.   Cardiovascular: S 1, S 2 RRR  Respiratory: CTA  Abdomen: BS present, soft, nt  Musculoskeletal: no edema.   Data Reviewed: Basic Metabolic Panel:  Recent Labs Lab 05/25/12 0444  NA 139  K 3.8  CL 105  CO2 26  GLUCOSE 109*  BUN 10  CREATININE 0.86  CALCIUM 8.0*   Liver Function Tests:  Recent Labs Lab 05/25/12 0444  AST 29  ALT 32  ALKPHOS 111  BILITOT 0.2*  PROT 6.0  ALBUMIN 2.8*   No results found for this basename: LIPASE, AMYLASE,  in the last 168 hours No  results found for this basename: AMMONIA,  in the last 168 hours CBC:  Recent Labs Lab 05/25/12 0444  WBC 8.3  HGB 10.2*  HCT 32.7*  MCV 75.3*  PLT 218   Cardiac Enzymes: No results found for this basename: CKTOTAL, CKMB, CKMBINDEX, TROPONINI,  in the last 168 hours BNP (last 3 results) No results found for this basename: PROBNP,  in the last 8760 hours CBG: No results found for this basename: GLUCAP,  in the last 168 hours  No results found for this or any previous visit (from the past 240 hour(s)).   Studies: No results found.  Scheduled Meds: . albuterol  2.5 mg Nebulization Q6H  . atorvastatin  20 mg Oral q1800  . ciprofloxacin  400 mg Intravenous Q12H  . diltiazem  120 mg Oral Daily  . enoxaparin (LOVENOX) injection  40 mg Subcutaneous Q24H  . metronidazole  500 mg Intravenous Q8H  . mometasone-formoterol  2 puff Inhalation BID  . QUEtiapine  50 mg Oral BID  . roflumilast  500 mcg Oral Daily  . tiotropium  18 mcg Inhalation Daily   Continuous Infusions: . 0.9 % NaCl with KCl 20 mEq / L 75 mL/hr at 05/25/12 8119    Principal Problem:  Cholecystitis, acute Active Problems:   Tobacco dependency   Hypertension   COPD (chronic obstructive pulmonary disease)   Nausea alone   Abdominal pain, right upper quadrant    Time spent: 35 minutes.     Catori Panozzo  [facility] Pager [phone]. If 7PM-7AM, please contact night-coverage at [email or web address], password [facility] 05/25/2012, 10:18 AM  LOS: 1 day

## 2012-05-26 ENCOUNTER — Inpatient Hospital Stay (HOSPITAL_COMMUNITY): Payer: No Typology Code available for payment source | Admitting: Anesthesiology

## 2012-05-26 ENCOUNTER — Encounter (HOSPITAL_COMMUNITY): Payer: Self-pay | Admitting: Certified Registered Nurse Anesthetist

## 2012-05-26 ENCOUNTER — Inpatient Hospital Stay (HOSPITAL_COMMUNITY): Payer: No Typology Code available for payment source

## 2012-05-26 ENCOUNTER — Encounter (HOSPITAL_COMMUNITY)
Admission: AD | Disposition: A | Discharge status: Home / Self Care | Payer: Self-pay | Source: Other Acute Inpatient Hospital | Attending: Internal Medicine

## 2012-05-26 ENCOUNTER — Encounter (HOSPITAL_COMMUNITY): Payer: Self-pay | Admitting: Anesthesiology

## 2012-05-26 HISTORY — PX: CHOLECYSTECTOMY: SHX55

## 2012-05-26 LAB — CBC
HCT: 33.3 % — ABNORMAL LOW (ref 39.0–52.0)
Hemoglobin: 10.4 g/dL — ABNORMAL LOW (ref 13.0–17.0)
MCV: 75 fL — ABNORMAL LOW (ref 78.0–100.0)
RBC: 4.44 MIL/uL (ref 4.22–5.81)
WBC: 7.1 10*3/uL (ref 4.0–10.5)

## 2012-05-26 LAB — PROTIME-INR: Prothrombin Time: 13.1 seconds (ref 11.6–15.2)

## 2012-05-26 LAB — SURGICAL PCR SCREEN: Staphylococcus aureus: POSITIVE — AB

## 2012-05-26 SURGERY — LAPAROSCOPIC CHOLECYSTECTOMY WITH INTRAOPERATIVE CHOLANGIOGRAM
Anesthesia: General | Wound class: Clean Contaminated

## 2012-05-26 MED ORDER — PHENYLEPHRINE HCL 10 MG/ML IJ SOLN
INTRAMUSCULAR | Status: DC | PRN
  Administered 2012-05-26: 120 ug via INTRAVENOUS
  Administered 2012-05-26: 160 ug via INTRAVENOUS
  Administered 2012-05-26 (×2): 80 ug via INTRAVENOUS

## 2012-05-26 MED ORDER — EPHEDRINE SULFATE 50 MG/ML IJ SOLN
INTRAMUSCULAR | Status: DC | PRN
  Administered 2012-05-26: 10 mg via INTRAVENOUS

## 2012-05-26 MED ORDER — OXYCODONE HCL 5 MG PO TABS: 5.0000 mg | ORAL_TABLET | Freq: Once | ORAL | Status: DC | PRN

## 2012-05-26 MED ORDER — ONDANSETRON HCL 4 MG/2ML IJ SOLN
INTRAMUSCULAR | Status: DC | PRN
  Administered 2012-05-26: 4 mg via INTRAVENOUS

## 2012-05-26 MED ORDER — HYDROMORPHONE HCL PF 1 MG/ML IJ SOLN
INTRAMUSCULAR | Status: AC
  Filled 2012-05-26: qty 1

## 2012-05-26 MED ORDER — FENTANYL CITRATE 0.05 MG/ML IJ SOLN
INTRAMUSCULAR | Status: DC | PRN
  Administered 2012-05-26: 50 ug via INTRAVENOUS
  Administered 2012-05-26: 100 ug via INTRAVENOUS

## 2012-05-26 MED ORDER — ONDANSETRON HCL 4 MG/2ML IJ SOLN: 4.0000 mg | Freq: Once | INTRAMUSCULAR | Status: DC | PRN

## 2012-05-26 MED ORDER — HYDROCODONE-ACETAMINOPHEN 5-325 MG PO TABS
1.0000 | ORAL_TABLET | ORAL | Status: DC | PRN
  Administered 2012-05-26 – 2012-05-27 (×5): 2 via ORAL
  Filled 2012-05-26 (×4): qty 2

## 2012-05-26 MED ORDER — ROCURONIUM BROMIDE 100 MG/10ML IV SOLN
INTRAVENOUS | Status: DC | PRN
  Administered 2012-05-26: 50 mg via INTRAVENOUS

## 2012-05-26 MED ORDER — HYDROMORPHONE HCL PF 1 MG/ML IJ SOLN
0.2500 mg | INTRAMUSCULAR | Status: DC | PRN
  Administered 2012-05-26 (×4): 0.5 mg via INTRAVENOUS

## 2012-05-26 MED ORDER — PROPOFOL 10 MG/ML IV BOLUS
INTRAVENOUS | Status: DC | PRN
  Administered 2012-05-26: 150 mg via INTRAVENOUS

## 2012-05-26 MED ORDER — SODIUM CHLORIDE 0.9 % IR SOLN
Status: DC | PRN
  Administered 2012-05-26: 1

## 2012-05-26 MED ORDER — NEOSTIGMINE METHYLSULFATE 1 MG/ML IJ SOLN
INTRAMUSCULAR | Status: DC | PRN
  Administered 2012-05-26: 4 mg via INTRAVENOUS

## 2012-05-26 MED ORDER — CHLORHEXIDINE GLUCONATE CLOTH 2 % EX PADS
6.0000 | MEDICATED_PAD | Freq: Every day | CUTANEOUS | Status: DC
  Administered 2012-05-26: 6 via TOPICAL

## 2012-05-26 MED ORDER — MIDAZOLAM HCL 5 MG/5ML IJ SOLN
INTRAMUSCULAR | Status: DC | PRN
  Administered 2012-05-26: 2 mg via INTRAVENOUS

## 2012-05-26 MED ORDER — BUPIVACAINE-EPINEPHRINE PF 0.25-1:200000 % IJ SOLN
INTRAMUSCULAR | Status: AC
  Filled 2012-05-26: qty 30

## 2012-05-26 MED ORDER — BUPIVACAINE-EPINEPHRINE 0.25% -1:200000 IJ SOLN
INTRAMUSCULAR | Status: DC | PRN
  Administered 2012-05-26: 10 mL

## 2012-05-26 MED ORDER — LACTATED RINGERS IV SOLN
INTRAVENOUS | Status: DC | PRN
  Administered 2012-05-26 (×2): via INTRAVENOUS

## 2012-05-26 MED ORDER — OXYCODONE HCL 5 MG/5ML PO SOLN: 5.0000 mg | Freq: Once | ORAL | Status: DC | PRN

## 2012-05-26 MED ORDER — SODIUM CHLORIDE 0.9 % IV SOLN
INTRAVENOUS | Status: DC | PRN
  Administered 2012-05-26: 13:00:00

## 2012-05-26 MED ORDER — LIDOCAINE HCL (CARDIAC) 20 MG/ML IV SOLN
INTRAVENOUS | Status: DC | PRN
  Administered 2012-05-26: 80 mg via INTRAVENOUS

## 2012-05-26 MED ORDER — HYDROCODONE-ACETAMINOPHEN 5-325 MG PO TABS
ORAL_TABLET | ORAL | Status: AC
  Filled 2012-05-26: qty 2

## 2012-05-26 MED ORDER — GLYCOPYRROLATE 0.2 MG/ML IJ SOLN
INTRAMUSCULAR | Status: DC | PRN
  Administered 2012-05-26: .6 mg via INTRAVENOUS

## 2012-05-26 SURGICAL SUPPLY — 43 items
APPLIER CLIP 5 13 M/L LIGAMAX5 (MISCELLANEOUS) ×2
APPLIER CLIP ROT 10 11.4 M/L (STAPLE)
BLADE SURG ROTATE 9660 (MISCELLANEOUS) IMPLANT
CANISTER SUCTION 2500CC (MISCELLANEOUS) ×2 IMPLANT
CHLORAPREP W/TINT 26ML (MISCELLANEOUS) ×2 IMPLANT
CLIP APPLIE 5 13 M/L LIGAMAX5 (MISCELLANEOUS) ×1 IMPLANT
CLIP APPLIE ROT 10 11.4 M/L (STAPLE) IMPLANT
CLOTH BEACON ORANGE TIMEOUT ST (SAFETY) ×2 IMPLANT
COVER MAYO STAND STRL (DRAPES) ×2 IMPLANT
COVER SURGICAL LIGHT HANDLE (MISCELLANEOUS) ×2 IMPLANT
DECANTER SPIKE VIAL GLASS SM (MISCELLANEOUS) ×4 IMPLANT
DERMABOND ADHESIVE PROPEN (GAUZE/BANDAGES/DRESSINGS) ×1
DERMABOND ADVANCED (GAUZE/BANDAGES/DRESSINGS) ×1
DERMABOND ADVANCED .7 DNX12 (GAUZE/BANDAGES/DRESSINGS) ×1 IMPLANT
DERMABOND ADVANCED .7 DNX6 (GAUZE/BANDAGES/DRESSINGS) ×1 IMPLANT
DRAPE C-ARM 42X72 X-RAY (DRAPES) ×2 IMPLANT
DRAPE UTILITY 15X26 W/TAPE STR (DRAPE) ×4 IMPLANT
DRSG TEGADERM 4X4.75 (GAUZE/BANDAGES/DRESSINGS) ×2 IMPLANT
ELECT REM PT RETURN 9FT ADLT (ELECTROSURGICAL) ×2
ELECTRODE REM PT RTRN 9FT ADLT (ELECTROSURGICAL) ×1 IMPLANT
GLOVE BIOGEL PI IND STRL 8 (GLOVE) ×1 IMPLANT
GLOVE BIOGEL PI INDICATOR 8 (GLOVE) ×1
GLOVE ECLIPSE 7.5 STRL STRAW (GLOVE) ×2 IMPLANT
GOWN STRL NON-REIN LRG LVL3 (GOWN DISPOSABLE) ×4 IMPLANT
KIT BASIN OR (CUSTOM PROCEDURE TRAY) ×2 IMPLANT
KIT ROOM TURNOVER OR (KITS) ×2 IMPLANT
NS IRRIG 1000ML POUR BTL (IV SOLUTION) ×2 IMPLANT
PAD ARMBOARD 7.5X6 YLW CONV (MISCELLANEOUS) ×4 IMPLANT
POUCH SPECIMEN RETRIEVAL 10MM (ENDOMECHANICALS) IMPLANT
SCISSORS LAP 5X35 DISP (ENDOMECHANICALS) IMPLANT
SET CHOLANGIOGRAPH 5 50 .035 (SET/KITS/TRAYS/PACK) ×2 IMPLANT
SET IRRIG TUBING LAPAROSCOPIC (IRRIGATION / IRRIGATOR) ×2 IMPLANT
SLEEVE ENDOPATH XCEL 5M (ENDOMECHANICALS) ×4 IMPLANT
SPECIMEN JAR SMALL (MISCELLANEOUS) ×2 IMPLANT
STRIP CLOSURE SKIN 1/2X4 (GAUZE/BANDAGES/DRESSINGS) ×2 IMPLANT
SUT MNCRL AB 4-0 PS2 18 (SUTURE) ×2 IMPLANT
TOWEL OR 17X24 6PK STRL BLUE (TOWEL DISPOSABLE) ×2 IMPLANT
TOWEL OR 17X26 10 PK STRL BLUE (TOWEL DISPOSABLE) ×2 IMPLANT
TRAY LAPAROSCOPIC (CUSTOM PROCEDURE TRAY) ×2 IMPLANT
TROCAR XCEL BLUNT TIP 100MML (ENDOMECHANICALS) ×2 IMPLANT
TROCAR XCEL NON-BLD 11X100MML (ENDOMECHANICALS) IMPLANT
TROCAR XCEL NON-BLD 5MMX100MML (ENDOMECHANICALS) ×2 IMPLANT
WATER STERILE IRR 1000ML POUR (IV SOLUTION) IMPLANT

## 2012-05-26 NOTE — Anesthesia Postprocedure Evaluation (Signed)
  Anesthesia Post-op Note  Patient: Larry Walters  Procedure(s) Performed: Procedure(s): LAPAROSCOPIC CHOLECYSTECTOMY WITH INTRAOPERATIVE CHOLANGIOGRAM (N/A)  Patient Location: PACU  Anesthesia Type:General  Level of Consciousness: awake, alert  and oriented  Airway and Oxygen Therapy: Patient Spontanous Breathing and Patient connected to nasal cannula oxygen  Post-op Pain: mild  Post-op Assessment: Post-op Vital signs reviewed  Post-op Vital Signs: Reviewed  Complications: No apparent anesthesia complications

## 2012-05-26 NOTE — Transfer of Care (Signed)
Immediate Anesthesia Transfer of Care Note  Patient: Larry Walters  Procedure(s) Performed: Procedure(s): LAPAROSCOPIC CHOLECYSTECTOMY WITH INTRAOPERATIVE CHOLANGIOGRAM (N/A)  Patient Location: PACU  Anesthesia Type:General  Level of Consciousness: awake, alert , oriented and patient cooperative  Airway & Oxygen Therapy: Patient Spontanous Breathing and Patient connected to nasal cannula oxygen  Post-op Assessment: Report given to PACU RN, Post -op Vital signs reviewed and stable and Patient moving all extremities X 4  Post vital signs: Reviewed and stable  Complications: No apparent anesthesia complications

## 2012-05-26 NOTE — Preoperative (Signed)
Beta Blockers   Reason not to administer Beta Blockers:Not Applicable 

## 2012-05-26 NOTE — Anesthesia Preprocedure Evaluation (Addendum)
Anesthesia Evaluation  Patient identified by MRN, date of birth, ID band Patient awake    Reviewed: Allergy & Precautions, H&P , NPO status , Patient's Chart, lab work & pertinent test results  Airway Mallampati: I TM Distance: >3 FB Neck ROM: Full    Dental  (+) Edentulous Upper, Edentulous Lower and Dental Advisory Given   Pulmonary shortness of breath and with exertion, COPD COPD inhaler and oxygen dependent, Current Smoker,  2L at home breath sounds clear to auscultation        Cardiovascular hypertension, Pt. on medications + CAD, + Past MI and + Peripheral Vascular Disease Rhythm:Regular Rate:Normal     Neuro/Psych Depression    GI/Hepatic   Endo/Other    Renal/GU      Musculoskeletal   Abdominal   Peds  Hematology   Anesthesia Other Findings   Reproductive/Obstetrics                          Anesthesia Physical Anesthesia Plan  ASA: III  Anesthesia Plan: General   Post-op Pain Management:    Induction: Intravenous  Airway Management Planned: Oral ETT  Additional Equipment:   Intra-op Plan:   Post-operative Plan: Extubation in OR  Informed Consent: I have reviewed the patients History and Physical, chart, labs and discussed the procedure including the risks, benefits and alternatives for the proposed anesthesia with the patient or authorized representative who has indicated his/her understanding and acceptance.   Dental advisory given  Plan Discussed with: CRNA, Anesthesiologist and Surgeon  Anesthesia Plan Comments:         Anesthesia Quick Evaluation

## 2012-05-26 NOTE — Progress Notes (Signed)
Patient wants to go ahead with surgery today.  Will add on for laparoscopic cholecystectomy with IOC Marta Lamas. Gae Bon, MD, FACS [phone] [facility] Surgery

## 2012-05-26 NOTE — Op Note (Signed)
OPERATIVE REPORT  DATE OF OPERATION: 05/24/2012 - 05/26/2012  PATIENT:  Larry Walters  62 y.o. male  PRE-OPERATIVE DIAGNOSIS:  Acute cholecystitis  POST-OPERATIVE DIAGNOSIS:  Acute cholecystitis  PROCEDURE:  Procedure(s): LAPAROSCOPIC CHOLECYSTECTOMY WITH INTRAOPERATIVE CHOLANGIOGRAM  SURGEON:  Surgeon(s): Cherylynn Ridges, MD  ASSISTANT: None  ANESTHESIA:   general  EBL: <50  ml  BLOOD ADMINISTERED: none  DRAINS: none   SPECIMEN:  Source of Specimen:  Gallbladder and stones  COUNTS CORRECT:  YES  PROCEDURE DETAILS: The patient was taken to the operating room and placed on the table in the supine position.  After an adequate endotracheal anesthetic was administered, he was prepped with ChloroPrep, and then draped in the usual manner exposing the entire abdomen laterally, inferiorly and up  to the costal margins.  After a proper timeout was performed including identifying the patient and the procedure to be performed, a supra-umbilical 1.5cm midline incision was made using a #15 blade.  This was taken down to the fascia which was then incised with a #15 blade.  The edges of the fascia were tented up with Kocher clamps as the preperitoneal space was penetrated with a Kelly clamp into the peritoneum.  Once this was done, a pursestring suture of 0 Vicryl was passed around the fascial opening.  This was subsequently used to secure the [place] cannula which was passed into the peritoneal cavity.  Once the [facility] cannula was in place, carbon dioxide gas was insufflated into the peritoneal cavity up to a maximal intra-abdominal pressure of 15mm Hg.The laparoscope, with attached camera and light source, was passed into the peritoneal cavity to visualize the direct insertion of two right upper quadrant 5mm cannulas, and a sup-xiphoid 10mm cannula.  Once all cannulas were in place, the dissection was begun.  Two ratcheted graspers were attached to the dome and infundibulum of the gallbladder and  retracted towards the anterior abdominal wall and the right upper quadrant.  Using cautery attached to a dissecting forceps, the peritoneum overlaying the triangle of Chalot and the hepatoduodenal triangle was dissected away exposing the cystic duct and the cystic artery.  A clip was placed on the gallbladder side of the cystic duct, then a cholecytodochotomy made using the laparoscopic scissors.  Through the cholecystodochotomy a Cook catheter was passed to performed a cholangiogram.  The cholangiogram showed good distal flow into the duodenum, good proximal filling, no intraductal defects, and no dilatation..  Once the cholangiogram was completed, the [facility] catheter was removed, and the distal cystic duct was clipped multiple times then transected.  The gallbladder was then dissected out of the hepatic bed without event.  It was retrieved from the abdomen using an EndoCatch bag without event.  Once the gallbladder was removed, the bed was inspected for hemostasis.  Once excellent hemostasis was obtained all gas and fluids were aspirated from above the liver, then the cannulas were removed.  The supra-umbilical incision was closed using the pursestring suture which was in place.  0.25% bupivicaine with epinephrine was injected at all sites.  All 10mm or greater cannula sites were close using a running subcuticular stitch of 4-0 Monocryl.  5.7mm cannula sites were closed with Dermabond only.Steri-Strips and Tagaderm were used to complete the dressings at all sites.  At this point all needle, sponge, and instrument counts were correct.The patient was awakened from anesthesia and taken to the PACU in stable condition.  PATIENT DISPOSITION:  PACU - hemodynamically stable.   Cherylynn Ridges 5/4/20141:32 PM

## 2012-05-26 NOTE — Progress Notes (Signed)
[facility] PROGRESS NOTE  Larry Walters AVW:[id number] DOB: 1950-02-08 DOA: 05/24/2012 PCP: Larry Hakim, MD  Assessment/Plan:  1. Cholecystitis: S/P Cholecystectomy 5-4. Diet per surgery.  Continue with cipro and flagyl.  2. COPD ; Continue with  advair. Albuterol.  3. HTN- Continue with Cardizem.  4. CAD: resume plavix when ok by surgery. 5. AAA; S/p repair.  6. Hypercholesterolemia: Continue with Lipitor.    Code Status: Full Family Communication: care discussed with patient.  Disposition Plan: home when stable.    Consultants:  Surgery.   Procedures:  none  Antibiotics:  Cipro 5-2  Flagyl 5-2  HPI/Subjective: Just came from surgery. Awake, answering questions. No dyspnea. Wants to drink, eat something.    Objective: Filed Vitals:   05/26/12 1415 05/26/12 1430 05/26/12 1445 05/26/12 1500  BP: 143/64 169/50 137/52 138/46  Pulse: 74 85 72 87  Temp:      TempSrc:      Resp: 12 19 17 19   Height:      Weight:      SpO2: 96% 95% 96% 96%    Intake/Output Summary (Last 24 hours) at 05/26/12 1535 Last data filed at 05/26/12 1345  Gross per 24 hour  Intake 4587.5 ml  Output      0 ml  Net 4587.5 ml   Filed Weights   05/24/12 1100  Weight: 61.3 kg (135 lb 2.3 oz)    Exam:   General:  No distress.   Cardiovascular: S 1, S 2 RRR  Respiratory: CTA  Abdomen: Defer , patient refuse exam.   Musculoskeletal: no edema.   Data Reviewed: Basic Metabolic Panel:  Recent Labs Lab 05/25/12 0444  NA 139  K 3.8  CL 105  CO2 26  GLUCOSE 109*  BUN 10  CREATININE 0.86  CALCIUM 8.0*   Liver Function Tests:  Recent Labs Lab 05/25/12 0444  AST 29  ALT 32  ALKPHOS 111  BILITOT 0.2*  PROT 6.0  ALBUMIN 2.8*   No results found for this basename: LIPASE, AMYLASE,  in the last 168 hours No results found for this basename: AMMONIA,  in the last 168 hours CBC:  Recent Labs Lab 05/25/12 0444 05/26/12 0620  WBC 8.3 7.1  HGB 10.2*  10.4*  HCT 32.7* 33.3*  MCV 75.3* 75.0*  PLT 218 208   Cardiac Enzymes: No results found for this basename: CKTOTAL, CKMB, CKMBINDEX, TROPONINI,  in the last 168 hours BNP (last 3 results) No results found for this basename: PROBNP,  in the last 8760 hours CBG: No results found for this basename: GLUCAP,  in the last 168 hours  Recent Results (from the past 240 hour(s))  SURGICAL PCR SCREEN     Status: Abnormal   Collection Time    05/26/12 10:49 AM      Result Value Range Status   MRSA, PCR POSITIVE (*) NEGATIVE Final   Staphylococcus aureus POSITIVE (*) NEGATIVE Final   Comment:            The Xpert SA Assay (FDA     approved for NASAL specimens     in patients over 57 years of age),     is one component of     a comprehensive surveillance     program.  Test performance has     been validated by [employer] for patients greater     than or equal to 15 year old.     It is not intended  to diagnose infection nor to     guide or monitor treatment.     Studies: Dg Cholangiogram Operative  05/26/2012  *RADIOLOGY REPORT*  Clinical Data:Acute cholecystitis  INTRAOPERATIVE CHOLANGIOGRAM  Technique:  Multiple fluoroscopic spot radiographs were obtained during intraoperative cholangiogram and are submitted for interpretation post-operatively.  Fluoroscopy Time: 10 seconds  Comparison: None available  Findings: Intraoperative cholangiogram performed during the cholecystectomy.  This demonstrates patency of the common bile duct.  Contrast passes easily into the duodenum.  No biliary dilatation or obstruction.  No filling defect evident.  IMPRESSION: Patent common bile duct.   Original Report Authenticated By: Larry Walters, M.D.     Scheduled Meds: . [facility] HOLD] albuterol  2.5 mg Nebulization QID  . [facility] HOLD] atorvastatin  20 mg Oral q1800  . [MAR HOLD] Chlorhexidine Gluconate Cloth  6 each Topical Q0600  . [facility] HOLD] ciprofloxacin  400 mg Intravenous Q12H  . [facility] HOLD] diltiazem   120 mg Oral Daily  . [MAR HOLD] enoxaparin (LOVENOX) injection  40 mg Subcutaneous Q24H  . HYDROcodone-acetaminophen      . HYDROmorphone      . HYDROmorphone      . [facility] HOLD] metronidazole  500 mg Intravenous Q8H  . [MAR HOLD] mometasone-formoterol  2 puff Inhalation BID  . [MAR HOLD] QUEtiapine  50 mg Oral BID  . [facility] HOLD] roflumilast  500 mcg Oral Daily  . [MAR HOLD] tiotropium  18 mcg Inhalation Daily   Continuous Infusions: . 0.9 % NaCl with KCl 20 mEq / L 75 mL/hr at 05/25/12 2251    Principal Problem:   Cholecystitis, acute Active Problems:   Tobacco dependency   Hypertension   COPD (chronic obstructive pulmonary disease)   Nausea alone   Abdominal pain, right upper quadrant    Time spent: 25 minutes.     Larry Walters  [facility] Pager [phone]. If 7PM-7AM, please contact night-coverage at [email or web address], password [facility] 05/26/2012, 3:35 PM  LOS: 2 days

## 2012-05-26 NOTE — Anesthesia Procedure Notes (Signed)
Procedure Name: Intubation Date/Time: 05/26/2012 12:08 PM Performed by: Rogelia Boga Pre-anesthesia Checklist: Patient identified, Emergency Drugs available, Suction available, Patient being monitored and Timeout performed Patient Re-evaluated:Patient Re-evaluated prior to inductionOxygen Delivery Method: Circle system utilized Preoxygenation: Pre-oxygenation with 100% oxygen Intubation Type: IV induction Ventilation: Mask ventilation without difficulty Laryngoscope Size: Mac and 4 Grade View: Grade I Tube type: Oral Tube size: 7.5 mm Number of attempts: 1 Airway Equipment and Method: Stylet Placement Confirmation: ETT inserted through vocal cords under direct vision,  positive ETCO2 and breath sounds checked- equal and bilateral Secured at: 21 cm Tube secured with: Tape Dental Injury: Teeth and Oropharynx as per pre-operative assessment

## 2012-05-27 ENCOUNTER — Encounter (HOSPITAL_COMMUNITY): Payer: Self-pay | Admitting: General Surgery

## 2012-05-27 DIAGNOSIS — I2510 Atherosclerotic heart disease of native coronary artery without angina pectoris: Secondary | ICD-10-CM

## 2012-05-27 LAB — COMPREHENSIVE METABOLIC PANEL
BUN: 6 mg/dL (ref 6–23)
CO2: 27 mEq/L (ref 19–32)
Calcium: 8.2 mg/dL — ABNORMAL LOW (ref 8.4–10.5)
Chloride: 105 mEq/L (ref 96–112)
Creatinine, Ser: 0.86 mg/dL (ref 0.50–1.35)
GFR calc Af Amer: >90 mL/min (ref >90)
GFR calc non Af Amer: >90 mL/min (ref >90)
Total Bilirubin: 0.2 mg/dL — ABNORMAL LOW (ref 0.3–1.2)

## 2012-05-27 LAB — CBC
HCT: 29.7 % — ABNORMAL LOW (ref 39.0–52.0)
MCH: 23.9 pg — ABNORMAL LOW (ref 26.0–34.0)
MCV: 76.3 fL — ABNORMAL LOW (ref 78.0–100.0)
RBC: 3.89 MIL/uL — ABNORMAL LOW (ref 4.22–5.81)
WBC: 8 10*3/uL (ref 4.0–10.5)

## 2012-05-27 MED ORDER — MUPIROCIN 2 % EX OINT: 1.0000 "application " | TOPICAL_OINTMENT | Freq: Two times a day (BID) | CUTANEOUS | Status: DC

## 2012-05-27 MED ORDER — HYDROCODONE-ACETAMINOPHEN 5-325 MG PO TABS: 1.0000 | ORAL_TABLET | ORAL | Status: DC | PRN

## 2012-05-27 MED ORDER — POLYETHYLENE GLYCOL 3350 17 G PO PACK
17.0000 g | PACK | Freq: Two times a day (BID) | ORAL | Status: DC
Start: 2012-05-27 — End: 2012-05-27

## 2012-05-27 MED ORDER — METRONIDAZOLE 500 MG PO TABS: 500.0000 mg | ORAL_TABLET | Freq: Three times a day (TID) | ORAL | Status: DC

## 2012-05-27 MED ORDER — CLOPIDOGREL BISULFATE 75 MG PO TABS: 75.0000 mg | ORAL_TABLET | Freq: Every day | ORAL | Status: DC

## 2012-05-27 NOTE — Progress Notes (Signed)
1 Day Post-Op  Subjective: Pt feeling good, no pain, N or v.  Tolerating diet well.  Ambulating.  Some flatus, no BM yet pending suppository.    Objective: Vital signs in last 24 hours: Temp:  [97.7 F (36.5 C)-99 F (37.2 C)] 98.2 F (36.8 C) (05/05 0430) Pulse Rate:  [66-87] 66 (05/05 0430) Resp:  [12-22] 19 (05/05 0430) BP: (113-188)/(46-96) 125/72 mmHg (05/05 0430) SpO2:  [93 %-99 %] 99 % (05/05 0430) Last BM Date: 05/24/12  Intake/Output from previous day: 05/04 0701 - 05/05 0700 In: 4642.5 [P.O.:600; I.V.:3442.5; IV Piggyback:600] Out: 1950 [Urine:1950] Intake/Output this shift:    PE: Gen:  Alert, NAD, pleasant Abd: Soft, NT/ND, +BS, no HSM, incisions C/D/I   Lab Results:   Recent Labs  05/26/12 0620 05/27/12 0444  WBC 7.1 8.0  HGB 10.4* 9.3*  HCT 33.3* 29.7*  PLT 208 215   BMET  Recent Labs  05/25/12 0444 05/27/12 0444  NA 139 141  K 3.8 3.5  CL 105 105  CO2 26 27  GLUCOSE 109* 121*  BUN 10 6  CREATININE 0.86 0.86  CALCIUM 8.0* 8.2*   PT/INR  Recent Labs  05/26/12 0620  LABPROT 13.1  INR 1.00   CMP     Component Value Date/Time   NA 141 05/27/2012 0444   NA 139 09/07/2011 1045   K 3.5 05/27/2012 0444   CL 105 05/27/2012 0444   CO2 27 05/27/2012 0444   GLUCOSE 121* 05/27/2012 0444   GLUCOSE 211* 09/07/2011 1045   BUN 6 05/27/2012 0444   BUN 9 09/07/2011 1045   CREATININE 0.86 05/27/2012 0444   CALCIUM 8.2* 05/27/2012 0444   PROT 5.6* 05/27/2012 0444   PROT 6.7 10/06/2011 0820   ALBUMIN 2.8* 05/27/2012 0444   AST 40* 05/27/2012 0444   ALT 33 05/27/2012 0444   ALKPHOS 92 05/27/2012 0444   BILITOT 0.2* 05/27/2012 0444   GFRNONAA >90 05/27/2012 0444   GFRAA >90 05/27/2012 0444   Lipase  No results found for this basename: lipase       Studies/Results: Dg Cholangiogram Operative  05/26/2012  *RADIOLOGY REPORT*  Clinical Data:Acute cholecystitis  INTRAOPERATIVE CHOLANGIOGRAM  Technique:  Multiple fluoroscopic spot radiographs were obtained during  intraoperative cholangiogram and are submitted for interpretation post-operatively.  Fluoroscopy Time: 10 seconds  Comparison: None available  Findings: Intraoperative cholangiogram performed during the cholecystectomy.  This demonstrates patency of the common bile duct.  Contrast passes easily into the duodenum.  No biliary dilatation or obstruction.  No filling defect evident.  IMPRESSION: Patent common bile duct.   Original Report Authenticated By: Judie Petit. Miles Costain, M.D.     Anti-infectives: Anti-infectives   Start     Dose/Rate Route Frequency Ordered Stop   05/24/12 1800  ciprofloxacin (CIPRO) IVPB 400 mg  Status:  Discontinued     400 mg 200 mL/hr over 60 Minutes Intravenous Every 12 hours 05/24/12 1322 05/26/12 1550   05/24/12 1500  metroNIDAZOLE (FLAGYL) IVPB 500 mg     500 mg 100 mL/hr over 60 Minutes Intravenous Every 8 hours 05/24/12 1322         Assessment/Plan POD #1 s/p lap chole with IOC 1.  Advance diet to regular diet 2.  IS and ambulation 3.  SCD's and lovenox 4.  Can go home today as long as passing flatus, ambulating well, tolerating diet, pain well controlled on orals 5.  F/u appt on 06/11/12 at 11:30am at [facility] office    LOS: 3 days  Larry Walters 05/27/2012, 8:31 AM Pager: [phone]

## 2012-05-27 NOTE — Discharge Summary (Signed)
Physician Discharge Summary  Larry Walters WUJ:[id number] DOB: 08/27/1950 DOA: 05/24/2012  PCP: Clydell Hakim, MD  Admit date: 05/24/2012 Discharge date: 05/27/2012  Time spent: 35 minutes  Recommendations for Outpatient Follow-up:  1. Follow up with surgery in 2 weeks.   Discharge Diagnoses:    Cholecystitis, acute, S/P cholecystectomy.    Tobacco dependency   Hypertension   COPD (chronic obstructive pulmonary disease)   Nausea alone   Abdominal pain, right upper quadrant   Discharge Condition: Stable.   Diet recommendation: low fat diet  Filed Weights   05/24/12 1100  Weight: 61.3 kg (135 lb 2.3 oz)    History of present illness:  Who has known gall stones but they had not given him any trouble. He woke up early this AM with severe abdominal pain and nausea. He went to the ER at [facility] where he had elevated liver enzymes an U/s that shows cholelithiasis, non mobile stone in the neck of the gallbladder and a + murphy sign. Surgeons at [facility] thought him to be too high risk as he has COPD (continue smoking) and vascular issues. Cardiology at [facility] said, he is cleared to be off plavix and is moderate risk for surgery. Edwards spoke with surgeon, Dr. Rayburn Ma who asked hospitalist to admit and he would consult.     Hospital Course:   62 year old with past medical history significant for coronary artery disease, COPD gold stage III who was transferred from [facility] for further  evaluation and treatment of cholelithiasis. Patient presented with right upper quadrant abdominal pain, nausea vomiting. Ultrasound showed gallstone. Dr. Lindie Spruce evaluated the patient and recommend cholecystectomy. Patient underwent cholecystectomy on 5-4. He tolerated procedure well. Patient was started on ciprofloxacin and Flagyl IV. He received 2 days of IV antibiotics. He will be discharged on 7 days of Flagyl. Patient doing well post procedure. No significant abdominal pain. He has not  pass gas. Surgery recommended to discharge the patient today and patient to follow up with them  in 2 weeks.  1. Cholecystitis: S/P Cholecystectomy 5-4. Diet per surgery. Patient was started on IV cipro and flagyl. He will be discharge on flagyl for 7 days. Patient denies abdominal pain. He tolerates breakfast. Surgery recommend to discharge patient today.  2. COPD ; Continue with advair. Albuterol.  3. HTN- Continue with Cardizem.  4. CAD: resume plavix on 5-6 per surgery recommendation.  5. AAA; S/p repair.  6. Hypercholesterolemia: Continue with Lipitor.  7.   Procedures:  Cholecystectomy 5-4  Consultations:  [place] surgery  Discharge Exam: Filed Vitals:   05/26/12 1544 05/26/12 2113 05/27/12 0430 05/27/12 0954  BP: 136/67 137/46 125/72   Pulse: 84 68 66   Temp: 99 F (37.2 C) 98.7 F (37.1 C) 98.2 F (36.8 C)   TempSrc: Oral Oral    Resp: 18 22 19    Height:      Weight:      SpO2: 95% 97% 99% 94%    General: No distress.  Cardiovascular: S 1, S 2 RRR Respiratory: CTA  Discharge Instructions  Discharge Orders   Future Appointments Provider Department Dept Phone   06/11/2012 11:30 AM [facility], [place] [phone]   Future Orders Complete By Expires     Increase activity slowly  As directed         Medication List    TAKE these medications       albuterol (2.5 MG/3ML) 0.083% nebulizer solution  Commonly known as:  PROVENTIL  Take 2.5 mg by nebulization every 6 (six) hours as needed for wheezing.     albuterol 108 (90 BASE) MCG/ACT inhaler  Commonly known as:  PROVENTIL HFA;VENTOLIN HFA  Inhale 2 puffs into the lungs every 6 (six) hours as needed for wheezing.     atorvastatin 20 MG tablet  Commonly known as:  LIPITOR  Take 1 tablet (20 mg total) by mouth daily.     clopidogrel 75 MG tablet  Commonly known as:  PLAVIX  Take 1 tablet (75 mg total) by mouth daily.     diltiazem 120 MG 24 hr capsule  Commonly known  as:  CARDIZEM CD  Take 1 capsule (120 mg total) by mouth daily.     Fluticasone-Salmeterol 250-50 MCG/DOSE Aepb  Commonly known as:  ADVAIR  Inhale 1 puff into the lungs every 12 (twelve) hours.     HYDROcodone-acetaminophen 5-325 MG per tablet  Commonly known as:  NORCO/VICODIN  Take 1-2 tablets by mouth every 4 (four) hours as needed.     metroNIDAZOLE 500 MG tablet  Commonly known as:  FLAGYL  Take 1 tablet (500 mg total) by mouth 3 (three) times daily.     QUEtiapine 50 MG tablet  Commonly known as:  SEROQUEL  Take 50 mg by mouth 2 (two) times daily.     roflumilast 500 MCG Tabs tablet  Commonly known as:  DALIRESP  Take 1 tablet (500 mcg total) by mouth daily.     tiotropium 18 MCG inhalation capsule  Commonly known as:  SPIRIVA  Place 18 mcg into inhaler and inhale daily.       Allergies  Allergen Reactions  . Citalopram     Celexa= "felt drunk"  . Mirtazapine     Remeron= "felt drunk:  Marland Kitchen Sertraline Hcl     Zoloft= "felt drunk"  . Penicillins Hives and Rash       Follow-up Information   Follow up with [facility] [place] On 06/11/2012. (appt at 11:30am, please arrive at 11:00am for check in)    Contact information:   [address]   [place] [address] [phone]        The results of significant diagnostics from this hospitalization (including imaging, microbiology, ancillary and laboratory) are listed below for reference.    Significant Diagnostic Studies: Dg Cholangiogram Operative  05/26/2012  *RADIOLOGY REPORT*  Clinical Data:Acute cholecystitis  INTRAOPERATIVE CHOLANGIOGRAM  Technique:  Multiple fluoroscopic spot radiographs were obtained during intraoperative cholangiogram and are submitted for interpretation post-operatively.  Fluoroscopy Time: 10 seconds  Comparison: None available  Findings: Intraoperative cholangiogram performed during the cholecystectomy.  This demonstrates patency of the common bile duct.  Contrast passes  easily into the duodenum.  No biliary dilatation or obstruction.  No filling defect evident.  IMPRESSION: Patent common bile duct.   Original Report Authenticated By: Judie Petit. Miles Costain, M.D.     Microbiology: Recent Results (from the past 240 hour(s))  SURGICAL PCR SCREEN     Status: Abnormal   Collection Time    05/26/12 10:49 AM      Result Value Range Status   MRSA, PCR POSITIVE (*) NEGATIVE Final   Staphylococcus aureus POSITIVE (*) NEGATIVE Final   Comment:            The Xpert SA Assay (FDA     approved for NASAL specimens     in patients over 60 years of age),     is one component  of     a comprehensive surveillance     program.  Test performance has     been validated by [facility] for patients greater     than or equal to 19 year old.     It is not intended     to diagnose infection nor to     guide or monitor treatment.     Labs: Basic Metabolic Panel:  Recent Labs Lab 05/25/12 0444 05/27/12 0444  NA 139 141  K 3.8 3.5  CL 105 105  CO2 26 27  GLUCOSE 109* 121*  BUN 10 6  CREATININE 0.86 0.86  CALCIUM 8.0* 8.2*   Liver Function Tests:  Recent Labs Lab 05/25/12 0444 05/27/12 0444  AST 29 40*  ALT 32 33  ALKPHOS 111 92  BILITOT 0.2* 0.2*  PROT 6.0 5.6*  ALBUMIN 2.8* 2.8*   No results found for this basename: LIPASE, AMYLASE,  in the last 168 hours No results found for this basename: AMMONIA,  in the last 168 hours CBC:  Recent Labs Lab 05/25/12 0444 05/26/12 0620 05/27/12 0444  WBC 8.3 7.1 8.0  HGB 10.2* 10.4* 9.3*  HCT 32.7* 33.3* 29.7*  MCV 75.3* 75.0* 76.3*  PLT 218 208 215   Cardiac Enzymes: No results found for this basename: CKTOTAL, CKMB, CKMBINDEX, TROPONINI,  in the last 168 hours BNP: BNP (last 3 results) No results found for this basename: PROBNP,  in the last 8760 hours CBG: No results found for this basename: GLUCAP,  in the last 168 hours     Signed:  Kalil Woessner  [facility] 05/27/2012, 12:57 PM

## 2012-05-27 NOTE — Progress Notes (Signed)
Wilmon Arms. Corliss Skains, MD, [facility]  05/27/2012 3:00 PM

## 2012-05-27 NOTE — Progress Notes (Signed)
DC HOME WITH WIFE, VERBALLY UNDERSTOOD DC INSTRUCTIONS

## 2012-06-06 LAB — COMPREHENSIVE METABOLIC PANEL
Albumin: 3.6 g/dL (ref 3.4–5.0)
Alkaline Phosphatase: 94 U/L (ref 50–136)
Anion Gap: 5 — ABNORMAL LOW (ref 7–16)
BUN: 10 mg/dL (ref 7–18)
Bilirubin,Total: 0.2 mg/dL (ref 0.2–1.0)
Chloride: 106 mmol/L (ref 98–107)
Creatinine: 0.72 mg/dL (ref 0.60–1.30)
EGFR (Non-African Amer.): >60
Osmolality: 279 (ref 275–301)
SGOT(AST): 25 U/L (ref 15–37)
SGPT (ALT): 32 U/L (ref 12–78)
Total Protein: 7.3 g/dL (ref 6.4–8.2)

## 2012-06-06 LAB — ETHANOL: Ethanol %: <0.003 % (ref 0.000–0.080)

## 2012-06-06 LAB — CBC
HCT: 36.5 % — ABNORMAL LOW (ref 40.0–52.0)
HGB: 11.4 g/dL — ABNORMAL LOW (ref 13.0–18.0)
MCHC: 31.2 g/dL — ABNORMAL LOW (ref 32.0–36.0)
MCV: 74 fL — ABNORMAL LOW (ref 80–100)
Platelet: 468 10*3/uL — ABNORMAL HIGH (ref 150–440)
RDW: 17.6 % — ABNORMAL HIGH (ref 11.5–14.5)
WBC: 11.2 10*3/uL — ABNORMAL HIGH (ref 3.8–10.6)

## 2012-06-06 LAB — TSH: Thyroid Stimulating Horm: 0.57 u[IU]/mL

## 2012-06-06 LAB — ACETAMINOPHEN LEVEL: Acetaminophen: <2 ug/mL

## 2012-06-07 ENCOUNTER — Inpatient Hospital Stay: Payer: Self-pay | Admitting: Psychiatry

## 2012-06-07 LAB — URINALYSIS, COMPLETE
Bacteria: NONE SEEN
Bilirubin,UR: NEGATIVE
Blood: NEGATIVE
Glucose,UR: 50 mg/dL
Ketone: NEGATIVE
Leukocyte Esterase: NEGATIVE
Nitrite: NEGATIVE
Ph: 7
Protein: NEGATIVE
RBC,UR: <1 /HPF
Specific Gravity: 1.012
Squamous Epithelial: NONE SEEN
WBC UR: 1 /HPF

## 2012-06-07 LAB — DRUG SCREEN, URINE
Amphetamines, Ur Screen: NEGATIVE
Barbiturates, Ur Screen: NEGATIVE
Benzodiazepine, Ur Scrn: POSITIVE
Cannabinoid 50 Ng, Ur ~~LOC~~: NEGATIVE
Cocaine Metabolite,Ur ~~LOC~~: NEGATIVE
MDMA (Ecstasy)Ur Screen: NEGATIVE
Methadone, Ur Screen: NEGATIVE
Opiate, Ur Screen: NEGATIVE
Phencyclidine (PCP) Ur S: NEGATIVE
Tricyclic, Ur Screen: NEGATIVE

## 2012-06-11 ENCOUNTER — Ambulatory Visit (INDEPENDENT_AMBULATORY_CARE_PROVIDER_SITE_OTHER): Payer: No Typology Code available for payment source | Admitting: Internal Medicine

## 2012-06-11 ENCOUNTER — Encounter (INDEPENDENT_AMBULATORY_CARE_PROVIDER_SITE_OTHER): Payer: Self-pay | Admitting: Internal Medicine

## 2012-06-11 VITALS — BP 150/72 | HR 76 | Temp 98.7°F | Resp 16 | Ht 65.0 in | Wt 136.4 lb

## 2012-06-11 DIAGNOSIS — K810 Acute cholecystitis: Secondary | ICD-10-CM

## 2012-06-11 NOTE — Patient Instructions (Signed)
May resume regular activity without restrictions. Follow up as needed. Call with questions or concerns.  

## 2012-06-11 NOTE — Progress Notes (Signed)
  Subjective: Pt returns to the clinic today after undergoing laparoscopic cholecystectomy on 05/26/12 by Dr. Lindie Spruce.  The patient is tolerating their diet well and is having no severe pain.  Bowel function is good.  No problems with the wounds.  However patient was seen in the ER at [facility] due to overdose of anxiety medicine.  He was admitted to behavioral health.  Apparently he had a medical workup and was told he had an infection but they didn't know where.  He denies abdominal pain, nausea, vomiting, diarrhea, constipation, fever or chills.  He was discharged yesterday and does know if he needs to continue the antibiotics  Objective: Vital signs in last 24 hours: Reviewed  PE: General: awake, NAD Abd: soft, non-tender, +bs, incisions well healed  Lab Results:  No results found for this basename: WBC, HGB, HCT, PLT,  in the last 72 hours BMET No results found for this basename: NA, K, CL, CO2, GLUCOSE, BUN, CREATININE, CALCIUM,  in the last 72 hours PT/INR No results found for this basename: LABPROT, INR,  in the last 72 hours CMP     Component Value Date/Time   NA 141 05/27/2012 0444   NA 139 09/07/2011 1045   K 3.5 05/27/2012 0444   CL 105 05/27/2012 0444   CO2 27 05/27/2012 0444   GLUCOSE 121* 05/27/2012 0444   GLUCOSE 211* 09/07/2011 1045   BUN 6 05/27/2012 0444   BUN 9 09/07/2011 1045   CREATININE 0.86 05/27/2012 0444   CALCIUM 8.2* 05/27/2012 0444   PROT 5.6* 05/27/2012 0444   PROT 6.7 10/06/2011 0820   ALBUMIN 2.8* 05/27/2012 0444   AST 40* 05/27/2012 0444   ALT 33 05/27/2012 0444   ALKPHOS 92 05/27/2012 0444   BILITOT 0.2* 05/27/2012 0444   GFRNONAA >90 05/27/2012 0444   GFRAA >90 05/27/2012 0444   Lipase  No results found for this basename: lipase       Studies/Results: No results found.  Anti-infectives: Anti-infectives   None       Assessment/Plan  1.  S/P Laparoscopic Cholecystectomy: doing well overall, unsure what he is being treated for so I will obtain the records from [facility] to  verify why he is on antibiotics.  I will call him once I have them.  For now he should continue the abx.  He may resume regular activity without restrictions, Pt will follow up with [place] [facility] unless records from [facility] show an issue related to surgery.     Marvelene Stoneberg 06/11/2012

## 2012-06-12 ENCOUNTER — Telehealth (INDEPENDENT_AMBULATORY_CARE_PROVIDER_SITE_OTHER): Payer: Self-pay | Admitting: General Surgery

## 2012-06-13 ENCOUNTER — Telehealth (INDEPENDENT_AMBULATORY_CARE_PROVIDER_SITE_OTHER): Payer: Self-pay | Admitting: General Surgery

## 2012-06-13 NOTE — Telephone Encounter (Signed)
Patient calling to see if we have received records from [facility] and if he should continue antibiotics. Please advise. [phone].

## 2012-06-13 NOTE — Telephone Encounter (Signed)
Spoke to [place]. Her and Marisue Ivan  received records and will review on Friday. They will call patient once Marisue Ivan reviews them.

## 2012-06-14 ENCOUNTER — Telehealth (INDEPENDENT_AMBULATORY_CARE_PROVIDER_SITE_OTHER): Payer: Self-pay | Admitting: General Surgery

## 2012-06-19 ENCOUNTER — Ambulatory Visit: Payer: Self-pay | Admitting: Family Medicine

## 2012-06-23 ENCOUNTER — Encounter: Payer: Self-pay | Admitting: Specialist

## 2012-06-23 HISTORY — PX: CARDIAC CATHETERIZATION: SHX172

## 2012-06-28 ENCOUNTER — Telehealth: Payer: Self-pay

## 2012-06-28 NOTE — Telephone Encounter (Signed)
See below and advise thanks 

## 2012-06-28 NOTE — Telephone Encounter (Signed)
See below

## 2012-06-28 NOTE — Telephone Encounter (Signed)
Add him to my schedule for next week.

## 2012-06-28 NOTE — Telephone Encounter (Signed)
Pt states his PCP needed to see his cardiologist, states when he does anything he gets SOB, also both his shoulders ache when he raises his arms above his head

## 2012-06-28 NOTE — Telephone Encounter (Signed)
Do you need to see him? Looks like he had same complaints at last OV?

## 2012-07-01 ENCOUNTER — Ambulatory Visit (INDEPENDENT_AMBULATORY_CARE_PROVIDER_SITE_OTHER): Payer: No Typology Code available for payment source | Admitting: Cardiovascular Disease

## 2012-07-01 ENCOUNTER — Encounter: Payer: Self-pay | Admitting: Cardiovascular Disease

## 2012-07-01 VITALS — BP 122/64 | HR 74 | Ht 65.0 in | Wt 135.2 lb

## 2012-07-01 DIAGNOSIS — R0602 Shortness of breath: Secondary | ICD-10-CM

## 2012-07-01 DIAGNOSIS — I2510 Atherosclerotic heart disease of native coronary artery without angina pectoris: Secondary | ICD-10-CM

## 2012-07-01 DIAGNOSIS — I739 Peripheral vascular disease, unspecified: Secondary | ICD-10-CM

## 2012-07-01 MED ORDER — LORAZEPAM 0.5 MG PO TABS: ORAL_TABLET | ORAL | Status: DC

## 2012-07-01 NOTE — Assessment & Plan Note (Signed)
His current symptoms of dyspnea with minimal activities are highly worrisome for angina equivalent currently New York Heart Association class III in spite of medical therapy. Given his severe COPD, the utility of stress testing is somewhat limited in her situation. He is also known to have an inferior scar on nuclear stress test which would likely interfere with accuracy of stress testing. Due to his symptoms and the above limitations of stress testing, I recommend proceeding with cardiac catheterization and possible coronary intervention. Most likely he will require pressure wire interrogation of the LAD if it's still in the moderate range. I will also obtain an echocardiogram to make sure there is no evidence of pulmonary hypertension. He did have cholecystectomy last month and obviously he is at risk of pulmonary embolism. However, I don't see evidence of lower extremity unilateral swelling to suggest DVT.

## 2012-07-01 NOTE — Patient Instructions (Addendum)
Your physician has requested that you have an echocardiogram. Echocardiography is a painless test that uses sound waves to create images of your heart. It provides your doctor with information about the size and shape of your heart and how well your heart's chambers and valves are working. This procedure takes approximately one hour. There are no restrictions for this procedure.  Your physician has requested that you have a cardiac catheterization. Cardiac catheterization is used to diagnose and/or treat various heart conditions. Doctors may recommend this procedure for a number of different reasons. The most common reason is to evaluate chest pain. Chest pain can be a symptom of coronary artery disease (CAD), and cardiac catheterization can show whether plaque is narrowing or blocking your heart's arteries. This procedure is also used to evaluate the valves, as well as measure the blood flow and oxygen levels in different parts of your heart. For further information please visit [email or web address] Please follow instruction sheet, as given.

## 2012-07-01 NOTE — Assessment & Plan Note (Signed)
He has no claudication. Aortoiliac duplex  recently showed patent stents. I plan on keeping him on Plavix indefinitely. He is not on aspirin due to bruising.

## 2012-07-01 NOTE — Telephone Encounter (Signed)
Error

## 2012-07-01 NOTE — Telephone Encounter (Signed)
Error, 

## 2012-07-01 NOTE — Progress Notes (Signed)
HPI  This is a 62 year old man who is here today for a followup visit. The patient has extensive medical problems. He has severe COPD. 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 He had endovascular repair done in January of 2013 by Dr. Wyn Quaker. The patient also had significant exertional dyspnea and tachycardia. He underwent a pharmacologic nuclear stress test last year which showed no evidence of ischemia with normal ejection fraction. There was possibility of a previous inferior apical infarct which was relatively small to moderate in size. He had an echocardiogram done which was overall unremarkable.  He also had significant right lower extremity claudication. Thus, I performed both cardiac catheterization, abdominal aortogram and lower extremity runoff at the same time in August of last year. Cardiac catheterization showed an occluded RCA with good left to right collaterals and moderate 50% stenosis in the proximal LAD. Peripheral angiography showed patent aortoiliac stents with significant 80% stenosis in the right common iliac artery distal to the previously placed stent. I thus placed a self-expanding stent and overlapped in with the previously placed stent without complications. His right lower extremity claudication resolved.  He has sinus tachycardia related to COPD which improved significantly with the addition of diltiazem. He underwent cholecystectomy last month. He has been experiencing dyspnea with minimal activities recently without associated chest discomfort. This is now new to him over the last few weeks after improvement in previous respiratory status.  Allergies  Allergen Reactions  . Citalopram     Celexa= "felt drunk"  . Mirtazapine     Remeron= "felt drunk:  Marland Kitchen Sertraline Hcl     Zoloft= "felt drunk"  . Penicillins Hives and Rash     Current Outpatient Prescriptions on File Prior to Visit  Medication Sig Dispense Refill  .  albuterol (PROVENTIL HFA;VENTOLIN HFA) 108 (90 BASE) MCG/ACT inhaler Inhale 2 puffs into the lungs every 6 (six) hours as needed for wheezing.      Marland Kitchen albuterol (PROVENTIL) (2.5 MG/3ML) 0.083% nebulizer solution Take 2.5 mg by nebulization every 6 (six) hours as needed for wheezing.      Marland Kitchen atorvastatin (LIPITOR) 20 MG tablet Take 1 tablet (20 mg total) by mouth daily.  30 tablet  6  . ciprofloxacin (CIPRO) 500 MG tablet       . clopidogrel (PLAVIX) 75 MG tablet Take 1 tablet (75 mg total) by mouth daily.  30 tablet  3  . diltiazem (CARDIZEM CD) 120 MG 24 hr capsule Take 1 capsule (120 mg total) by mouth daily.  90 capsule  3  . FLUoxetine (PROZAC) 10 MG capsule       . Fluticasone-Salmeterol (ADVAIR) 250-50 MCG/DOSE AEPB Inhale 1 puff into the lungs every 12 (twelve) hours.      Marland Kitchen HYDROcodone-acetaminophen (NORCO/VICODIN) 5-325 MG per tablet Take 1-2 tablets by mouth every 4 (four) hours as needed.  30 tablet  0  . metroNIDAZOLE (FLAGYL) 500 MG tablet Take 1 tablet (500 mg total) by mouth 3 (three) times daily.  21 tablet  0  . predniSONE (DELTASONE) 10 MG tablet       . QUEtiapine (SEROQUEL) 50 MG tablet Take 50 mg by mouth 2 (two) times daily.      . roflumilast (DALIRESP) 500 MCG TABS tablet Take 1 tablet (500 mcg total) by mouth daily.  90 tablet  0  . tiotropium (SPIRIVA) 18 MCG inhalation capsule Place 18 mcg into inhaler and inhale daily.      Marland Kitchen  triamcinolone cream (KENALOG) 0.1 %        No current facility-administered medications on file prior to visit.     Past Medical History  Diagnosis Date  . COPD (chronic obstructive pulmonary disease)   . Mold exposure   . Tobacco dependency   . Anxiety   . Hypercholesteremia   . COPD (chronic obstructive pulmonary disease)   . Hypertension   . Peripheral vascular disease     Angiography in August of 2013 showed patent aortoiliac stent graft with no significant leaks, 80% stenosis in the right common iliac artery just distal to the stent.  He had a successful self-expanding stent placement.  . History of multiple pulmonary nodules   . Depression   . AAA (abdominal aortic aneurysm)     s/p endovascular repair by Dr. Wyn Quaker in 01/2011  . Coronary artery disease 08/2011    Cardiac cath: Occluded RCA with good left-to-right collaterals, 50% proximal LAD stenosis.  . Gallstones   . Myocardial infarction   . Shortness of breath      Past Surgical History  Procedure Laterality Date  . Cataract extraction  12/20/2010    left eye  . Vein surgery  02/10/2011  . Resection of fatty tumor      back  . Angioplasty / stenting iliac  2013  . Cardiac catheterization  09/13/2011    [facility] iliac artery stent placement  . Abdominal aortic aneurysm repair      Stent graft done by Dr. dew.  . Cholecystectomy N/A 05/26/2012    Procedure: LAPAROSCOPIC CHOLECYSTECTOMY WITH INTRAOPERATIVE CHOLANGIOGRAM;  Surgeon: Cherylynn Ridges, MD;  Location: [facility] OR;  Service: General;  Laterality: N/A;     Family History  Problem Relation Age of Onset  . COPD Mother   . Aneurysm Father      History   Social History  . Marital Status: Married    Spouse Name: N/A    Number of Children: 1  . Years of Education: N/A   Occupational History  . Maintenence work    Social History Main Topics  . Smoking status: Current Every Day Smoker -- 0.30 packs/day for 35 years    Types: Cigarettes  . Smokeless tobacco: Never Used  . Alcohol Use: No  . Drug Use: No  . Sexually Active: Not on file   Other Topics Concern  . Not on file   Social History Narrative  . No narrative on file     PHYSICAL EXAM   BP 122/64  Pulse 74  Ht 5\' 5"  (1.651 m)  Wt 61.349 kg (135 lb 4 oz)  BMI 22.51 kg/m2  SpO2 97% Constitutional: He is oriented to person, place, and time. He appears well-developed and well-nourished. No distress.  HENT: No nasal discharge.  Head: Normocephalic and atraumatic.  Eyes: Pupils are equal and round. Right eye exhibits no  discharge. Left eye exhibits no discharge.  Neck: Normal range of motion. Neck supple. No JVD present. No thyromegaly present.  Cardiovascular: Normal rate, regular rhythm, normal heart sounds and. Exam reveals no gallop and no friction rub. No murmur heard.  Pulmonary/Chest: Effort normal and diminished breath sounds. No stridor. No respiratory distress. He has no wheezes. He has no rales. He exhibits no tenderness.  Abdominal: Soft. Bowel sounds are normal. He exhibits no distension. There is no tenderness. There is no rebound and no guarding.  Musculoskeletal: Normal range of motion. He exhibits no edema and no tenderness.  Neurological: He is alert  and oriented to person, place, and time. Coordination normal.  Skin: Skin is warm and dry. No rash noted. He is not diaphoretic. No erythema. No pallor.  Psychiatric: He has a normal mood and affect. His behavior is normal. Judgment and thought content normal.  Right groin:The femoral pulse is  normal. Distal pulses are also palpable     EKG: Sinus  Rhythm  -Incomplete right bundle branch block.   -Right axis -consider right ventricular hypertrophy.   ABNORMAL    ASSESSMENT AND PLAN

## 2012-07-02 ENCOUNTER — Other Ambulatory Visit (INDEPENDENT_AMBULATORY_CARE_PROVIDER_SITE_OTHER): Payer: No Typology Code available for payment source

## 2012-07-02 ENCOUNTER — Other Ambulatory Visit: Payer: Self-pay

## 2012-07-02 ENCOUNTER — Other Ambulatory Visit: Payer: No Typology Code available for payment source

## 2012-07-02 DIAGNOSIS — R0602 Shortness of breath: Secondary | ICD-10-CM

## 2012-07-02 LAB — CBC WITH DIFFERENTIAL
Eos: 2 % (ref 0–5)
Immature Grans (Abs): 0 10*3/uL (ref 0.0–0.1)
Immature Granulocytes: 0 % (ref 0–2)
Lymphs: 16 % (ref 14–46)
MCH: 24.5 pg — ABNORMAL LOW (ref 26.6–33.0)
Monocytes Absolute: 1 10*3/uL — ABNORMAL HIGH (ref 0.1–0.9)
Monocytes: 9 % (ref 4–12)
Neutrophils Relative %: 72 % (ref 40–74)
Platelets: 324 10*3/uL (ref 155–379)
RBC: 5.19 x10E6/uL (ref 4.14–5.80)
WBC: 12 10*3/uL — ABNORMAL HIGH (ref 3.4–10.8)

## 2012-07-02 LAB — BASIC METABOLIC PANEL
BUN: 8 mg/dL (ref 8–27)
CO2: 20 mmol/L (ref 19–28)
Calcium: 9 mg/dL (ref 8.6–10.2)
Chloride: 102 mmol/L (ref 97–108)
Creatinine, Ser: 0.94 mg/dL (ref 0.76–1.27)
Glucose: 95 mg/dL (ref 65–99)

## 2012-07-02 LAB — PROTIME-INR: INR: 1 (ref 0.8–1.2)

## 2012-07-02 MED ORDER — LORAZEPAM 0.5 MG PO TABS: ORAL_TABLET | ORAL | Status: DC

## 2012-07-09 ENCOUNTER — Encounter: Payer: Self-pay | Admitting: Cardiovascular Disease

## 2012-07-09 ENCOUNTER — Ambulatory Visit: Payer: Self-pay | Admitting: Cardiovascular Disease

## 2012-07-09 DIAGNOSIS — I2510 Atherosclerotic heart disease of native coronary artery without angina pectoris: Secondary | ICD-10-CM

## 2012-07-10 DIAGNOSIS — I2510 Atherosclerotic heart disease of native coronary artery without angina pectoris: Secondary | ICD-10-CM

## 2012-07-10 LAB — BASIC METABOLIC PANEL
Anion Gap: 8 (ref 7–16)
Calcium, Total: 8.7 mg/dL (ref 8.5–10.1)
Co2: 27 mmol/L (ref 21–32)
EGFR (African American): >60
Glucose: 102 mg/dL — ABNORMAL HIGH (ref 65–99)
Osmolality: 278 (ref 275–301)
Sodium: 140 mmol/L (ref 136–145)

## 2012-07-11 ENCOUNTER — Telehealth: Payer: Self-pay

## 2012-07-11 NOTE — Telephone Encounter (Signed)
Will attempt TCM #1 6/20 D/c today 6/19

## 2012-07-11 NOTE — Telephone Encounter (Signed)
Message copied by [facility], Samuell Knoble E on Thu Jul 11, 2012  3:49 PM ------      Message from: [place] E      Created: Thu Jul 11, 2012  3:40 PM      Regarding: tcm       Patient called today to make f/u from cath, will be tcm patient  If we met the 2 day window for discharge ------

## 2012-07-11 NOTE — Telephone Encounter (Signed)
TCM  

## 2012-07-12 ENCOUNTER — Encounter: Payer: Self-pay | Admitting: *Deleted

## 2012-07-12 NOTE — Telephone Encounter (Signed)
Patient contacted regarding discharge from [facility]  on 03/12/12.  Patient understands to follow up with provider Dr Kirke Corin on 07/22/12 at 8:45 at [facility] . Patient understands discharge instructions? yes Patient understands medications and regiment? yes Patient understands to bring all medications to this visit? yes

## 2012-07-22 ENCOUNTER — Ambulatory Visit (INDEPENDENT_AMBULATORY_CARE_PROVIDER_SITE_OTHER): Payer: No Typology Code available for payment source | Admitting: Cardiovascular Disease

## 2012-07-22 ENCOUNTER — Encounter: Payer: Self-pay | Admitting: Cardiovascular Disease

## 2012-07-22 VITALS — BP 138/60 | HR 75 | Ht 65.0 in | Wt 137.0 lb

## 2012-07-22 DIAGNOSIS — I739 Peripheral vascular disease, unspecified: Secondary | ICD-10-CM

## 2012-07-22 DIAGNOSIS — R000 Tachycardia, unspecified: Secondary | ICD-10-CM

## 2012-07-22 DIAGNOSIS — I6529 Occlusion and stenosis of unspecified carotid artery: Secondary | ICD-10-CM

## 2012-07-22 DIAGNOSIS — Z720 Tobacco use: Secondary | ICD-10-CM

## 2012-07-22 DIAGNOSIS — F17200 Nicotine dependence, unspecified, uncomplicated: Secondary | ICD-10-CM

## 2012-07-22 DIAGNOSIS — I6521 Occlusion and stenosis of right carotid artery: Secondary | ICD-10-CM

## 2012-07-22 DIAGNOSIS — I2510 Atherosclerotic heart disease of native coronary artery without angina pectoris: Secondary | ICD-10-CM

## 2012-07-22 NOTE — Progress Notes (Signed)
HPI  This is a 62 year old man who is here today for a followup visit. The patient has extensive medical problems. He has severe COPD. 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 He had endovascular repair done in January of 2013 by Dr. Wyn Quaker. The patient also had significant exertional dyspnea and tachycardia. He underwent a pharmacologic nuclear stress test last year which showed no evidence of ischemia with normal ejection fraction. 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 He had an echocardiogram done which was overall unremarkable.  He also had significant right lower extremity claudication. Thus, I performed both cardiac catheterization, abdominal aortogram and lower extremity runoff at the same time in August of 2013. Cardiac catheterization showed an occluded RCA with good left to right collaterals and moderate 50% stenosis in the proximal LAD. Peripheral angiography showed patent aortoiliac stents with significant 80% stenosis in the right common iliac artery distal to the previously placed stent. I thus placed a self-expanding stent and overlapped in with the previously placed stent without complications. His right lower extremity claudication resolved.  He has sinus tachycardia related to COPD which improved significantly with the addition of diltiazem. He was seen recently for significant worsening of exertional dyspnea. Thus, I proceeded with cardiac catheterization via the right radial artery. This showed slight progression of the stenosis in the proximal LAD to 60%. I performed pressure wire interrogation which showed an FFR ratio of 0.77 which indicated hemodynamic significance to the lesion. I placed a drug-eluting stent without complications. He feels better. His dyspnea is improved. He denies chest discomfort.  Allergies  Allergen Reactions  . Citalopram     Celexa= "felt drunk"  .  Mirtazapine     Remeron= "felt drunk:  Marland Kitchen Sertraline Hcl     Zoloft= "felt drunk"  . Penicillins Hives and Rash     Current Outpatient Prescriptions on File Prior to Visit  Medication Sig Dispense Refill  . albuterol (PROVENTIL HFA;VENTOLIN HFA) 108 (90 BASE) MCG/ACT inhaler Inhale 2 puffs into the lungs every 6 (six) hours as needed for wheezing.      Marland Kitchen albuterol (PROVENTIL) (2.5 MG/3ML) 0.083% nebulizer solution Take 2.5 mg by nebulization every 6 (six) hours as needed for wheezing.      Marland Kitchen atorvastatin (LIPITOR) 20 MG tablet Take 1 tablet (20 mg total) by mouth daily.  30 tablet  6  . clopidogrel (PLAVIX) 75 MG tablet Take 1 tablet (75 mg total) by mouth daily.  30 tablet  3  . diltiazem (CARDIZEM CD) 120 MG 24 hr capsule Take 1 capsule (120 mg total) by mouth daily.  90 capsule  3  . Fluticasone-Salmeterol (ADVAIR) 250-50 MCG/DOSE AEPB Inhale 1 puff into the lungs every 12 (twelve) hours.      . NON FORMULARY Oxygen 2 liters daily at bedtime.      . roflumilast (DALIRESP) 500 MCG TABS tablet Take 1 tablet (500 mcg total) by mouth daily.  90 tablet  0  . tiotropium (SPIRIVA) 18 MCG inhalation capsule Place 18 mcg into inhaler and inhale daily.      Marland Kitchen triamcinolone cream (KENALOG) 0.1 %        No current facility-administered medications on file prior to visit.     Past Medical History  Diagnosis Date  . COPD (chronic obstructive pulmonary disease)   . Mold exposure   . Tobacco dependency   . Anxiety   .  Hypercholesteremia   . COPD (chronic obstructive pulmonary disease)   . Hypertension   . Peripheral vascular disease     Angiography in August of 2013 showed patent aortoiliac stent graft with no significant leaks, 80% stenosis in the right common iliac artery just distal to the stent. He had a successful self-expanding stent placement.  . History of multiple pulmonary nodules   . Depression   . AAA (abdominal aortic aneurysm)     s/p endovascular repair by Dr. Wyn Quaker in 01/2011    . Gallstones   . Myocardial infarction   . Shortness of breath   . Coronary artery disease 08/2011    Cardiac cath: Occluded RCA with good left-to-right collaterals, 50% proximal LAD stenosis. Repeat cardiac catheterization in June of 2014 due to a worsening exertional dyspnea: 60% proximal LAD stenosis with FFR ratio of 0. 78. He underwent successful drug-eluting stent placement to the proximal LAD with a 3.0 x 15 mm Xience stent     Past Surgical History  Procedure Laterality Date  . Cataract extraction  12/20/2010    left eye  . Vein surgery  02/10/2011  . Resection of fatty tumor      back  . Angioplasty / stenting iliac  2013  . Cardiac catheterization  09/13/2011    [facility] iliac artery stent placement  . Abdominal aortic aneurysm repair      Stent graft done by Dr. dew.  . Cholecystectomy N/A 05/26/2012    Procedure: LAPAROSCOPIC CHOLECYSTECTOMY WITH INTRAOPERATIVE CHOLANGIOGRAM;  Surgeon: Cherylynn Ridges, MD;  Location: [facility] OR;  Service: General;  Laterality: N/A;  . Cardiac catheterization  6/14    [facility];60% stent in the proximal LAD;     Family History  Problem Relation Age of Onset  . COPD Mother   . Aneurysm Father      History   Social History  . Marital Status: Married    Spouse Name: N/A    Number of Children: 1  . Years of Education: N/A   Occupational History  . Maintenence work    Social History Main Topics  . Smoking status: Current Every Day Smoker -- 0.30 packs/day for 35 years    Types: Cigarettes  . Smokeless tobacco: Never Used  . Alcohol Use: No  . Drug Use: No  . Sexually Active: Not on file   Other Topics Concern  . Not on file   Social History Narrative  . No narrative on file     PHYSICAL EXAM   BP 138/60  Pulse 75  Ht 5\' 5"  (1.651 m)  Wt 137 lb (62.143 kg)  BMI 22.8 kg/m2 Constitutional: He is oriented to person, place, and time. He appears well-developed and well-nourished. No distress.  HENT: No nasal discharge.   Head: Normocephalic and atraumatic.  Eyes: Pupils are equal and round. Right eye exhibits no discharge. Left eye exhibits no discharge.  Neck: Normal range of motion. Neck supple. No JVD present. No thyromegaly present.  Cardiovascular: Normal rate, regular rhythm, normal heart sounds and. Exam reveals no gallop and no friction rub. No murmur heard.  Pulmonary/Chest: Effort normal and diminished breath sounds. No stridor. No respiratory distress. He has no wheezes. He has no rales. He exhibits no tenderness.  Abdominal: Soft. Bowel sounds are normal. He exhibits no distension. There is no tenderness. There is no rebound and no guarding.  Musculoskeletal: Normal range of motion. He exhibits no edema and no tenderness.  Neurological: He is alert and oriented to  person, place, and time. Coordination normal.  Skin: Skin is warm and dry. No rash noted. He is not diaphoretic. No erythema. No pallor.  Psychiatric: He has a normal mood and affect. His behavior is normal. Judgment and thought content normal.  Right groin:The femoral pulse is  normal. Distal pulses are also palpable Right radial pulse is normal with no hematoma.     [id number]:[id number]  Rhythm  Low voltage in limb leads.   -Incomplete right bundle branch block.   -Right axis -consider right ventricular hypertrophy.   ABNORMAL    ABNORMAL    ASSESSMENT AND PLAN

## 2012-07-22 NOTE — Assessment & Plan Note (Signed)
I again had a prolonged discussion with him about the importance of smoking cessation. Unfortunately, his anxiety makes it difficult for him to quit smoking.

## 2012-07-22 NOTE — Assessment & Plan Note (Signed)
No recurrent claudication.

## 2012-07-22 NOTE — Assessment & Plan Note (Signed)
He is doing well after a recent drug-eluting stent placement to the proximal LAD. He reports improvement in his exertional dyspnea. Continue dual antiplatelet therapy which will likely be continued indefinitely due to his extensive peripheral arterial disease. I advised him to attend cardiac rehabilitation but he is not able to afford that. I asked him to start low level exercise on his own.

## 2012-07-22 NOTE — Assessment & Plan Note (Signed)
Moderate right carotid stenosis which will be monitored. Continue medical therapy. He is due for carotid Doppler in July of 2014.

## 2012-07-22 NOTE — Patient Instructions (Addendum)
Continue same medications.  Follow up in 6 months.  

## 2012-07-23 ENCOUNTER — Ambulatory Visit: Payer: Self-pay | Admitting: Internal Medicine

## 2012-07-23 ENCOUNTER — Encounter: Payer: Self-pay | Admitting: Specialist

## 2012-07-25 ENCOUNTER — Encounter: Payer: Self-pay | Admitting: Cardiovascular Disease

## 2012-08-03 ENCOUNTER — Inpatient Hospital Stay: Payer: Self-pay | Admitting: Internal Medicine

## 2012-08-03 LAB — COMPREHENSIVE METABOLIC PANEL
Alkaline Phosphatase: 124 U/L (ref 50–136)
Anion Gap: 8 (ref 7–16)
BUN: 9 mg/dL (ref 7–18)
Chloride: 106 mmol/L (ref 98–107)
Co2: 25 mmol/L (ref 21–32)
Creatinine: 0.92 mg/dL (ref 0.60–1.30)
EGFR (Non-African Amer.): >60
Osmolality: 278 (ref 275–301)
Total Protein: 7.1 g/dL (ref 6.4–8.2)

## 2012-08-03 LAB — CK TOTAL AND CKMB (NOT AT ARMC)
CK, Total: 852 U/L — ABNORMAL HIGH (ref 35–232)
CK-MB: 1.4 ng/mL (ref 0.5–3.6)

## 2012-08-03 LAB — CBC
HCT: 23.8 % — ABNORMAL LOW (ref 40.0–52.0)
HGB: 7.5 g/dL — ABNORMAL LOW (ref 13.0–18.0)
MCH: 24.5 pg — ABNORMAL LOW (ref 26.0–34.0)
MCV: 78 fL — ABNORMAL LOW (ref 80–100)
Platelet: 340 10*3/uL (ref 150–440)
RBC: 3.04 10*6/uL — ABNORMAL LOW (ref 4.40–5.90)
RDW: 22.7 % — ABNORMAL HIGH (ref 11.5–14.5)
WBC: 9.6 10*3/uL (ref 3.8–10.6)

## 2012-08-03 LAB — IRON AND TIBC
Iron Bind.Cap.(Total): 391 ug/dL (ref 250–450)
Iron: 17 ug/dL — ABNORMAL LOW (ref 65–175)

## 2012-08-03 LAB — TROPONIN I
Troponin-I: <0.02 ng/mL
Troponin-I: <0.02 ng/mL

## 2012-08-03 LAB — FERRITIN: Ferritin (ARMC): 4 ng/mL — ABNORMAL LOW (ref 8–388)

## 2012-08-04 LAB — CBC WITH DIFFERENTIAL/PLATELET
Basophil #: 0.1 10*3/uL (ref 0.0–0.1)
Basophil %: 0.5 %
Eosinophil #: 0 10*3/uL (ref 0.0–0.7)
Eosinophil %: 0 %
HCT: 21.9 % — ABNORMAL LOW (ref 40.0–52.0)
HGB: 7 g/dL — ABNORMAL LOW (ref 13.0–18.0)
Lymphocyte #: 0.4 10*3/uL — ABNORMAL LOW (ref 1.0–3.6)
Lymphocyte %: 3.9 %
MCH: 24.7 pg — ABNORMAL LOW (ref 26.0–34.0)
MCHC: 31.8 g/dL — ABNORMAL LOW (ref 32.0–36.0)
MCV: 78 fL — ABNORMAL LOW (ref 80–100)
Monocyte %: 3.2 %
Neutrophil #: 9.1 10*3/uL — ABNORMAL HIGH (ref 1.4–6.5)
Neutrophil %: 92.4 %
Platelet: 303 10*3/uL (ref 150–440)
RBC: 2.82 10*6/uL — ABNORMAL LOW (ref 4.40–5.90)
RDW: 23.1 % — ABNORMAL HIGH (ref 11.5–14.5)

## 2012-08-04 LAB — BASIC METABOLIC PANEL
Calcium, Total: 8.4 mg/dL — ABNORMAL LOW (ref 8.5–10.1)
Chloride: 107 mmol/L (ref 98–107)
Creatinine: 1.03 mg/dL (ref 0.60–1.30)
EGFR (African American): >60

## 2012-08-04 LAB — LIPID PANEL
HDL Cholesterol: 62 mg/dL — ABNORMAL HIGH (ref 40–60)
VLDL Cholesterol, Calc: 5 mg/dL (ref 5–40)

## 2012-08-05 LAB — OCCULT BLOOD X 1 CARD TO LAB, STOOL: Occult Blood, Feces: NEGATIVE

## 2012-08-05 LAB — HEMOGLOBIN
HGB: 6.9 g/dL — ABNORMAL LOW (ref 13.0–18.0)
HGB: 8.5 g/dL — ABNORMAL LOW (ref 13.0–18.0)

## 2012-08-08 ENCOUNTER — Ambulatory Visit: Payer: Self-pay | Admitting: Internal Medicine

## 2012-08-08 LAB — CBC CANCER CENTER
HCT: 32.3 % — ABNORMAL LOW (ref 40.0–52.0)
Lymphocyte #: 1 x10 3/mm (ref 1.0–3.6)
Lymphocyte %: 6 %
MCH: 25.7 pg — ABNORMAL LOW (ref 26.0–34.0)
MCHC: 31.5 g/dL — ABNORMAL LOW (ref 32.0–36.0)
MCV: 82 fL (ref 80–100)
Monocyte %: 4 %
Neutrophil #: 14.6 x10 3/mm — ABNORMAL HIGH (ref 1.4–6.5)
Platelet: 316 x10 3/mm (ref 150–440)
RDW: 25.1 % — ABNORMAL HIGH (ref 11.5–14.5)
WBC: 16.4 x10 3/mm — ABNORMAL HIGH (ref 3.8–10.6)

## 2012-08-08 LAB — COMPREHENSIVE METABOLIC PANEL
Albumin: 3.4 g/dL (ref 3.4–5.0)
Alkaline Phosphatase: 95 U/L (ref 50–136)
BUN: 15 mg/dL (ref 7–18)
Bilirubin,Total: 0.4 mg/dL (ref 0.2–1.0)
Chloride: 105 mmol/L (ref 98–107)
Co2: 29 mmol/L (ref 21–32)
Creatinine: 0.91 mg/dL (ref 0.60–1.30)
EGFR (African American): >60
Glucose: 166 mg/dL — ABNORMAL HIGH (ref 65–99)
Osmolality: 282 (ref 275–301)
SGPT (ALT): 21 U/L (ref 12–78)
Sodium: 139 mmol/L (ref 136–145)

## 2012-08-08 LAB — PROTIME-INR: INR: 0.9

## 2012-08-11 ENCOUNTER — Inpatient Hospital Stay: Payer: Self-pay | Admitting: Internal Medicine

## 2012-08-11 LAB — CBC
HCT: 32.3 % — ABNORMAL LOW (ref 40.0–52.0)
HGB: 10 g/dL — ABNORMAL LOW (ref 13.0–18.0)
MCH: 25.3 pg — ABNORMAL LOW (ref 26.0–34.0)
MCHC: 31 g/dL — ABNORMAL LOW (ref 32.0–36.0)
RBC: 3.95 10*6/uL — ABNORMAL LOW (ref 4.40–5.90)
RDW: 25.4 % — ABNORMAL HIGH (ref 11.5–14.5)
WBC: 13.2 10*3/uL — ABNORMAL HIGH (ref 3.8–10.6)

## 2012-08-11 LAB — BASIC METABOLIC PANEL
Calcium, Total: 8 mg/dL — ABNORMAL LOW (ref 8.5–10.1)
Creatinine: 0.9 mg/dL (ref 0.60–1.30)
EGFR (African American): >60
Glucose: 107 mg/dL — ABNORMAL HIGH (ref 65–99)
Osmolality: 273 (ref 275–301)
Potassium: 4 mmol/L (ref 3.5–5.1)

## 2012-08-11 LAB — TROPONIN I: Troponin-I: <0.02 ng/mL

## 2012-08-12 LAB — CBC WITH DIFFERENTIAL/PLATELET
Basophil #: 0 10*3/uL (ref 0.0–0.1)
Basophil %: 0 %
Eosinophil #: 0 10*3/uL (ref 0.0–0.7)
Eosinophil %: 0.1 %
HGB: 10.2 g/dL — ABNORMAL LOW (ref 13.0–18.0)
Lymphocyte #: 0.3 10*3/uL — ABNORMAL LOW (ref 1.0–3.6)
Lymphocyte %: 3.2 %
MCH: 26.5 pg (ref 26.0–34.0)
MCV: 82 fL (ref 80–100)
Neutrophil #: 9.9 10*3/uL — ABNORMAL HIGH (ref 1.4–6.5)
Platelet: 205 10*3/uL (ref 150–440)
RBC: 3.86 10*6/uL — ABNORMAL LOW (ref 4.40–5.90)
RDW: 25.2 % — ABNORMAL HIGH (ref 11.5–14.5)

## 2012-08-12 LAB — CK TOTAL AND CKMB (NOT AT ARMC)
CK, Total: 66 U/L (ref 35–232)
CK, Total: 43 U/L (ref 35–232)
CK-MB: 4.5 ng/mL — ABNORMAL HIGH (ref 0.5–3.6)
CK-MB: 2.9 ng/mL (ref 0.5–3.6)
CK-MB: 1.8 ng/mL (ref 0.5–3.6)

## 2012-08-12 LAB — TROPONIN I: Troponin-I: <0.02 ng/mL

## 2012-08-12 LAB — BASIC METABOLIC PANEL
Anion Gap: 5 — ABNORMAL LOW (ref 7–16)
BUN: 11 mg/dL (ref 7–18)
Calcium, Total: 8.5 mg/dL (ref 8.5–10.1)
Co2: 29 mmol/L (ref 21–32)
Creatinine: 1 mg/dL (ref 0.60–1.30)
Osmolality: 282 (ref 275–301)
Potassium: 5 mmol/L (ref 3.5–5.1)

## 2012-08-13 LAB — CBC WITH DIFFERENTIAL/PLATELET
Basophil #: 0.1 10*3/uL (ref 0.0–0.1)
Basophil %: 0.3 %
Eosinophil #: 0 10*3/uL (ref 0.0–0.7)
Eosinophil %: 0 %
HCT: 28.9 % — ABNORMAL LOW (ref 40.0–52.0)
HGB: 9.1 g/dL — ABNORMAL LOW (ref 13.0–18.0)
Lymphocyte #: 0.4 10*3/uL — ABNORMAL LOW (ref 1.0–3.6)
MCHC: 31.5 g/dL — ABNORMAL LOW (ref 32.0–36.0)
Monocyte #: 0.9 x10 3/mm (ref 0.2–1.0)
Monocyte %: 4.2 %
Neutrophil %: 93.3 %
RBC: 3.5 10*6/uL — ABNORMAL LOW (ref 4.40–5.90)
RDW: 24.5 % — ABNORMAL HIGH (ref 11.5–14.5)
WBC: 20.4 10*3/uL — ABNORMAL HIGH (ref 3.8–10.6)

## 2012-08-13 LAB — BASIC METABOLIC PANEL
BUN: 16 mg/dL (ref 7–18)
Calcium, Total: 8.4 mg/dL — ABNORMAL LOW (ref 8.5–10.1)
Chloride: 106 mmol/L (ref 98–107)
Creatinine: 0.96 mg/dL (ref 0.60–1.30)
EGFR (African American): >60
EGFR (Non-African Amer.): >60
Glucose: 236 mg/dL — ABNORMAL HIGH (ref 65–99)
Potassium: 4.3 mmol/L (ref 3.5–5.1)
Sodium: 138 mmol/L (ref 136–145)

## 2012-08-14 LAB — CBC WITH DIFFERENTIAL/PLATELET
Basophil #: 0 10*3/uL (ref 0.0–0.1)
Basophil %: 0 %
Eosinophil %: 0 %
HGB: 8.6 g/dL — ABNORMAL LOW (ref 13.0–18.0)
Lymphocyte #: 0.5 10*3/uL — ABNORMAL LOW (ref 1.0–3.6)
MCHC: 31.3 g/dL — ABNORMAL LOW (ref 32.0–36.0)
Monocyte #: 0.8 x10 3/mm (ref 0.2–1.0)
Monocyte %: 3.3 %
Neutrophil #: 23.6 10*3/uL — ABNORMAL HIGH (ref 1.4–6.5)
Neutrophil %: 94.9 %
Platelet: 213 10*3/uL (ref 150–440)
RBC: 3.32 10*6/uL — ABNORMAL LOW (ref 4.40–5.90)

## 2012-08-14 LAB — HEMOGLOBIN: HGB: 8.7 g/dL — ABNORMAL LOW (ref 13.0–18.0)

## 2012-08-14 LAB — IRON AND TIBC
Iron Bind.Cap.(Total): 330 ug/dL (ref 250–450)
Iron: 25 ug/dL — ABNORMAL LOW (ref 65–175)
Unbound Iron-Bind.Cap.: 305 ug/dL

## 2012-08-14 LAB — FERRITIN: Ferritin (ARMC): 36 ng/mL (ref 8–388)

## 2012-08-15 LAB — CBC WITH DIFFERENTIAL/PLATELET
Basophil %: 0 %
Eosinophil #: 0 10*3/uL (ref 0.0–0.7)
Eosinophil %: 0 %
HCT: 27.1 % — ABNORMAL LOW (ref 40.0–52.0)
Lymphocyte #: 0.4 10*3/uL — ABNORMAL LOW (ref 1.0–3.6)
MCH: 26 pg (ref 26.0–34.0)
MCV: 82 fL (ref 80–100)
Monocyte #: 0.8 x10 3/mm (ref 0.2–1.0)
RDW: 24.8 % — ABNORMAL HIGH (ref 11.5–14.5)

## 2012-08-15 LAB — BASIC METABOLIC PANEL
Anion Gap: 4 — ABNORMAL LOW (ref 7–16)
Calcium, Total: 7.8 mg/dL — ABNORMAL LOW (ref 8.5–10.1)
Chloride: 107 mmol/L (ref 98–107)
Co2: 30 mmol/L (ref 21–32)
EGFR (African American): >60
Potassium: 3.7 mmol/L (ref 3.5–5.1)

## 2012-08-16 LAB — CULTURE, BLOOD (SINGLE)

## 2012-08-23 ENCOUNTER — Ambulatory Visit: Payer: Self-pay | Admitting: Internal Medicine

## 2012-08-28 LAB — COMPREHENSIVE METABOLIC PANEL
Alkaline Phosphatase: 108 U/L (ref 50–136)
Anion Gap: 10 (ref 7–16)
BUN: 10 mg/dL (ref 7–18)
Co2: 27 mmol/L (ref 21–32)
EGFR (African American): >60
Potassium: 3.9 mmol/L (ref 3.5–5.1)
SGOT(AST): 12 U/L — ABNORMAL LOW (ref 15–37)
SGPT (ALT): 30 U/L (ref 12–78)

## 2012-08-28 LAB — IRON AND TIBC
Iron Bind.Cap.(Total): 372 ug/dL (ref 250–450)
Iron: 115 ug/dL (ref 65–175)

## 2012-08-28 LAB — FERRITIN: Ferritin (ARMC): 13 ng/mL (ref 8–388)

## 2012-08-29 LAB — PROT IMMUNOELECTROPHORES(ARMC)

## 2012-08-30 LAB — CANCER CENTER HEMOGLOBIN: HGB: 10 g/dL — ABNORMAL LOW (ref 13.0–18.0)

## 2012-08-31 ENCOUNTER — Emergency Department: Payer: Self-pay | Admitting: Emergency Medicine

## 2012-08-31 LAB — COMPREHENSIVE METABOLIC PANEL
Alkaline Phosphatase: 112 U/L (ref 50–136)
Anion Gap: 6 — ABNORMAL LOW (ref 7–16)
BUN: 9 mg/dL (ref 7–18)
Calcium, Total: 8.6 mg/dL (ref 8.5–10.1)
Chloride: 102 mmol/L (ref 98–107)
Co2: 29 mmol/L (ref 21–32)
Creatinine: 0.88 mg/dL (ref 0.60–1.30)
EGFR (African American): >60
Glucose: 155 mg/dL — ABNORMAL HIGH (ref 65–99)
Osmolality: 276 (ref 275–301)
Potassium: 3.8 mmol/L (ref 3.5–5.1)
SGOT(AST): 15 U/L (ref 15–37)
Total Protein: 6.9 g/dL (ref 6.4–8.2)

## 2012-08-31 LAB — CBC
HGB: 10.2 g/dL — ABNORMAL LOW (ref 13.0–18.0)
MCH: 26 pg (ref 26.0–34.0)
MCHC: 32 g/dL (ref 32.0–36.0)
MCV: 81 fL (ref 80–100)
Platelet: 254 10*3/uL (ref 150–440)
RBC: 3.94 10*6/uL — ABNORMAL LOW (ref 4.40–5.90)
WBC: 10.8 10*3/uL — ABNORMAL HIGH (ref 3.8–10.6)

## 2012-08-31 LAB — CK TOTAL AND CKMB (NOT AT ARMC): CK-MB: 1.6 ng/mL (ref 0.5–3.6)

## 2012-09-03 LAB — CBC CANCER CENTER
Basophil #: 0.1 x10 3/mm (ref 0.0–0.1)
Eosinophil %: 0.3 %
HCT: 32.2 % — ABNORMAL LOW (ref 40.0–52.0)
HGB: 10.4 g/dL — ABNORMAL LOW (ref 13.0–18.0)
MCH: 26.6 pg (ref 26.0–34.0)
MCHC: 32.3 g/dL (ref 32.0–36.0)
MCV: 83 fL (ref 80–100)
Monocyte #: 1 x10 3/mm (ref 0.2–1.0)
Monocyte %: 8.2 %
Neutrophil #: 10.4 x10 3/mm — ABNORMAL HIGH (ref 1.4–6.5)
Neutrophil %: 81.2 %
Platelet: 299 x10 3/mm (ref 150–440)
RDW: 23.3 % — ABNORMAL HIGH (ref 11.5–14.5)
WBC: 12.9 x10 3/mm — ABNORMAL HIGH (ref 3.8–10.6)

## 2012-09-16 LAB — CANCER CENTER HEMOGLOBIN: HGB: 9.8 g/dL — ABNORMAL LOW (ref 13.0–18.0)

## 2012-09-20 ENCOUNTER — Observation Stay: Payer: Self-pay | Admitting: Hematology and Oncology

## 2012-09-20 LAB — CBC WITH DIFFERENTIAL/PLATELET
Basophil %: 0.8 %
Eosinophil %: 1.1 %
Lymphocyte #: 2.3 10*3/uL (ref 1.0–3.6)
Lymphocyte %: 16.5 %
MCHC: 31.7 g/dL — ABNORMAL LOW (ref 32.0–36.0)
MCV: 80 fL (ref 80–100)
Monocyte #: 1.3 x10 3/mm — ABNORMAL HIGH (ref 0.2–1.0)
Monocyte %: 9.4 %
Neutrophil #: 10.1 10*3/uL — ABNORMAL HIGH (ref 1.4–6.5)
Platelet: 297 10*3/uL (ref 150–440)
WBC: 14 10*3/uL — ABNORMAL HIGH (ref 3.8–10.6)

## 2012-09-20 LAB — COMPREHENSIVE METABOLIC PANEL
Albumin: 3 g/dL — ABNORMAL LOW (ref 3.4–5.0)
Bilirubin,Total: 0.2 mg/dL (ref 0.2–1.0)
Calcium, Total: 8.2 mg/dL — ABNORMAL LOW (ref 8.5–10.1)
Chloride: 105 mmol/L (ref 98–107)
Co2: 29 mmol/L (ref 21–32)
EGFR (African American): >60
EGFR (Non-African Amer.): >60
Glucose: 132 mg/dL — ABNORMAL HIGH (ref 65–99)
Potassium: 3.2 mmol/L — ABNORMAL LOW (ref 3.5–5.1)
SGOT(AST): 14 U/L — ABNORMAL LOW (ref 15–37)

## 2012-09-20 LAB — CANCER CENTER HEMOGLOBIN: HGB: 9.1 g/dL — ABNORMAL LOW (ref 13.0–18.0)

## 2012-09-21 LAB — RETICULOCYTES
Absolute Retic Count: 0.1471 10*6/uL (ref 0.019–0.186)
Reticulocyte: 4.46 % — ABNORMAL HIGH (ref 0.4–3.1)

## 2012-09-21 LAB — OCCULT BLOOD X 1 CARD TO LAB, STOOL: Occult Blood, Feces: NEGATIVE

## 2012-09-23 ENCOUNTER — Ambulatory Visit: Payer: Self-pay | Admitting: Internal Medicine

## 2012-09-24 ENCOUNTER — Telehealth: Payer: Self-pay

## 2012-09-24 DIAGNOSIS — I714 Abdominal aortic aneurysm, without rupture: Secondary | ICD-10-CM

## 2012-09-24 NOTE — Telephone Encounter (Signed)
Pt states he has been in and out of the hospital, states he is losing blood somewhere, not sure where, states [facility] had to give him blood and iron. Wants to know if maybe he is bleeding from one of the stents. Please call.

## 2012-09-24 NOTE — Telephone Encounter (Signed)
Patient states that he has been seeing Dr Lorre Nick for his blood loss for about a month and a half. He is concerned that they are not able to find source of bleeding and wanted an appointment with Dr Kirke Corin before December. Patient has not had a colonoscopy or endoscopy, they are trying to arrange. Recently in hospital for blood transfusion and has also getting iron three times a week. Explained that this did not sound like a cardiac issue but he would like Dr Jari Sportsman recommendations. Will forward to him for review.

## 2012-09-24 NOTE — Telephone Encounter (Signed)
Advised patient and will send to scheduling

## 2012-09-24 NOTE — Telephone Encounter (Signed)
He should get GI evaluation for anemia and check with his PCP about this as well.  He is due to have his stents checked in October anyway but I doubt this would be a cause of blood loss. Schedule him for aortoiliac Duplex in early October.

## 2012-09-24 NOTE — Telephone Encounter (Signed)
Fowarding to [facility] Triage today

## 2012-09-30 LAB — FERRITIN: Ferritin (ARMC): 93 ng/mL (ref 8–388)

## 2012-10-07 ENCOUNTER — Other Ambulatory Visit: Payer: Self-pay

## 2012-10-07 MED ORDER — CLOPIDOGREL BISULFATE 75 MG PO TABS: 75.0000 mg | ORAL_TABLET | Freq: Every day | ORAL | Status: DC

## 2012-10-07 NOTE — Telephone Encounter (Signed)
Refilled Plavix sent to [facility] pharmacy.

## 2012-10-11 LAB — CANCER CENTER HEMOGLOBIN: HGB: 12.8 g/dL — ABNORMAL LOW (ref 13.0–18.0)

## 2012-10-11 LAB — POTASSIUM: Potassium: 3.7 mmol/L (ref 3.5–5.1)

## 2012-10-11 LAB — FERRITIN: Ferritin (ARMC): 29 ng/mL (ref 8–388)

## 2012-10-18 LAB — FERRITIN: Ferritin (ARMC): 16 ng/mL (ref 8–388)

## 2012-10-18 LAB — HEMOGLOBIN: HGB: 11.8 g/dL — ABNORMAL LOW (ref 13.0–18.0)

## 2012-10-22 ENCOUNTER — Encounter: Payer: Self-pay | Admitting: Physician Assistant

## 2012-10-22 ENCOUNTER — Ambulatory Visit (INDEPENDENT_AMBULATORY_CARE_PROVIDER_SITE_OTHER): Payer: No Typology Code available for payment source | Admitting: Physician Assistant

## 2012-10-22 VITALS — BP 148/84 | HR 78 | Ht 65.0 in | Wt 144.8 lb

## 2012-10-22 DIAGNOSIS — D509 Iron deficiency anemia, unspecified: Secondary | ICD-10-CM

## 2012-10-22 DIAGNOSIS — I739 Peripheral vascular disease, unspecified: Secondary | ICD-10-CM

## 2012-10-22 DIAGNOSIS — R079 Chest pain, unspecified: Secondary | ICD-10-CM

## 2012-10-22 DIAGNOSIS — Z720 Tobacco use: Secondary | ICD-10-CM

## 2012-10-22 DIAGNOSIS — I2510 Atherosclerotic heart disease of native coronary artery without angina pectoris: Secondary | ICD-10-CM

## 2012-10-22 DIAGNOSIS — R0781 Pleurodynia: Secondary | ICD-10-CM

## 2012-10-22 DIAGNOSIS — F17200 Nicotine dependence, unspecified, uncomplicated: Secondary | ICD-10-CM

## 2012-10-22 DIAGNOSIS — R071 Chest pain on breathing: Secondary | ICD-10-CM

## 2012-10-22 MED ORDER — NITROGLYCERIN 0.4 MG SL SUBL: 0.4000 mg | SUBLINGUAL_TABLET | SUBLINGUAL | Status: AC | PRN

## 2012-10-22 NOTE — Assessment & Plan Note (Signed)
Chest pain described more pleuritic in description. No ischemic changes on EKG. The patient has not been taking ASA consistently. Will hold ASA, continue Plavix leading to EGD/c-scopy. Stressed the importance of resuming after GI/anemia work-up complete. He has not tolerated BBs well in the past after d/w Dr. Kirke Corin. Will continue statin. NTG SL PRN prescribed.

## 2012-10-22 NOTE — Assessment & Plan Note (Signed)
EGD/colonoscopy planned for 10/3. Discussed the importance of managing anemia from a cardiac standpoint. He will continue to follow-up with hematology for further monitoring/management.

## 2012-10-22 NOTE — Patient Instructions (Addendum)
Please take Plavix 75 mg daily. Hold off on aspirin until your colonoscopy and upper endoscopy are complete.   We will continue diltiazem. You have not tolerated beta blockers such as bisoprolol in the past.   We will hold off on increasing atorvastatin.   We will provide with nitroglycerin as needed. Use this for chest pressure.   We will see you on 10/15 for your ultrasound.

## 2012-10-22 NOTE — Assessment & Plan Note (Signed)
He denies claudication. PEx indicates sparse LE hair distribution, trace DP/PT pulse on the L and L femoral bruits. Continue Plavix, atorvastatin. Aoroiliac duplex planned for 10/15.

## 2012-10-22 NOTE — Progress Notes (Signed)
Patient ID: Larry Walters, male   DOB: July 06, 1950, 62 y.o.   MRN: [id number]          Date:  10/22/2012   ID:  Larry Walters, DOB 1950/09/08, MRN [id number]  PCP:  Clydell Hakim, MD  Primary Cardiologist:  Judie Petit. Kirke Corin, MD   History of Present Illness:  Larry Walters is a 62 y.o. male w/ PMHx s/f CAD, PVD, advanced COPD, iron deficiency anemia, h/o AAA (s/p endovascular repair by Dr. Wyn Quaker), medical noncompliance, h/o pulmonary nodules (monitored serially), HTN, HLD and ongoing tobacco dependence who presents today for follow-up.   Brief cardiovascular history:  Last year the patient reported exertional dyspnea and LE claudication. He underwent cardiac cath, abdominal aortogram and LE runoff in 08/2011. Cardiac cath indicated occluded RCA w/ good L-R collaterals, 50% prox LAD stenosis. PV study revealed an 80% RCIA stenosis just distal to prior stenting. Repeat PCI was performed overlapped w/ prior stenting. Claudication improved. He reported worsening exertional dyspnea 06/2012. He underwent repeat cath showing 60% prox LAD disease; FFR 0.77. He underwent successful DES placement w/ resolution of symptoms. Given, PVD indefinite DAPT recommended along with aggressive medical therapy and tobacco cessation advised.   Most recently, he has undergone prolonged work-up and treatment for iron deficiency anemia. A microcytic anemia (Hgb 7.5, MCV 78) was incidentally identified 07/2012 in the setting of A/COPD. FOBT negative at that time. F/u hematology/GI was recommended. Plans for EGD/colo were advised, but the patient refused initially. He had also refused iron supplementation due to constipation. He was admitted briefly in 08/2012 for transfusion (1 pRBC) and iron. He eventually agreed to receiving Venofer IV via consecutive hematology follow-ups, although more concentrated Stark Bray was recommended. Hgb continues to trend down (Hgb 13.8->12.8->11.8 from 9/8 to 9/26). He has ERG/colo tentatively planned  for 10/3.   Artoiliac duplex has been scheduled for 10/15 for routine monitoring. He has been declined for consideration of lung transplantation at [facility]. He is considerably anxious regarding his upcoming GI work-up. He is scheduled to see hematology tomorrow. He reports two fleeting episodes of sharp, needle-like R sided chest pain lasting for ~15 s w/o radiation or associated symptoms aggravated by cough. No relation to meals or exertion. Does not have NTG at home. No fevers, chills or yellow/green sputum. He admits to not taking ASA daily. No claudication. No frank bloody stools. Continues to smoke 3 cigs/day.   EKG: NSR, 78 bpm, RBBB, no ST/T changes  Wt Readings from Last 3 Encounters:  10/22/12 144 lb 12 oz (65.658 kg)  07/22/12 137 lb (62.143 kg)  07/01/12 135 lb 4 oz (61.349 kg)     Past Medical History  Diagnosis Date  . COPD (chronic obstructive pulmonary disease)   . Mold exposure   . Tobacco dependency   . Anxiety   . Hypercholesteremia   . COPD (chronic obstructive pulmonary disease)   . Hypertension   . Peripheral vascular disease     Angiography in August of 2013 showed patent aortoiliac stent graft with no significant leaks, 80% stenosis in the right common iliac artery just distal to the stent. He had a successful self-expanding stent placement.  . History of multiple pulmonary nodules   . Depression   . AAA (abdominal aortic aneurysm)     s/p endovascular repair by Dr. Wyn Quaker in 01/2011  . Gallstones   . Myocardial infarction   . Coronary artery disease 08/2011    Cardiac cath: Occluded RCA with good left-to-right collaterals, 50% proximal LAD stenosis. Repeat cardiac  catheterization in June of 2014 due to a worsening exertional dyspnea: 60% proximal LAD stenosis with FFR ratio of 0. 78. He underwent successful drug-eluting stent placement to the proximal LAD with a 3.0 x 15 mm Xience stent  . Iron deficiency     Current Outpatient Prescriptions  Medication Sig  Dispense Refill  . albuterol (PROVENTIL HFA;VENTOLIN HFA) 108 (90 BASE) MCG/ACT inhaler Inhale 2 puffs into the lungs every 6 (six) hours as needed for wheezing.      Marland Kitchen albuterol (PROVENTIL) (2.5 MG/3ML) 0.083% nebulizer solution Take 2.5 mg by nebulization every 6 (six) hours as needed for wheezing.      Marland Kitchen aspirin 81 MG tablet Take 81 mg by mouth daily.      Marland Kitchen atorvastatin (LIPITOR) 20 MG tablet Take 1 tablet (20 mg total) by mouth daily.  30 tablet  6  . clopidogrel (PLAVIX) 75 MG tablet Take 1 tablet (75 mg total) by mouth daily.  30 tablet  3  . diltiazem (CARDIZEM CD) 120 MG 24 hr capsule Take 1 capsule (120 mg total) by mouth daily.  90 capsule  3  . Fluticasone-Salmeterol (ADVAIR) 250-50 MCG/DOSE AEPB Inhale 1 puff into the lungs every 12 (twelve) hours.      Marland Kitchen KLOR-CON M10 10 MEQ tablet Take 10 mEq by mouth daily.       . NON FORMULARY Oxygen 2 liters daily at bedtime.      . predniSONE (DELTASONE) 10 MG tablet Take 20 mg by mouth daily.       . roflumilast (DALIRESP) 500 MCG TABS tablet Take 1 tablet (500 mcg total) by mouth daily.  90 tablet  0  . tiotropium (SPIRIVA) 18 MCG inhalation capsule Place 18 mcg into inhaler and inhale daily.      . traMADol (ULTRAM) 50 MG tablet Take 50 mg by mouth daily.       Marland Kitchen triamcinolone cream (KENALOG) 0.1 %        No current facility-administered medications for this visit.    Allergies:    Allergies  Allergen Reactions  . Citalopram     Celexa= "felt drunk"  . Mirtazapine     Remeron= "felt drunk:  Marland Kitchen Sertraline Hcl     Zoloft= "felt drunk"  . Penicillins Hives and Rash    Social History:  The patient  reports that he has been smoking Cigarettes.  He has a 10.5 pack-year smoking history. He has never used smokeless tobacco. He reports that he does not drink alcohol or use illicit drugs.   Family History:  Family History  Problem Relation Age of Onset  . COPD Mother   . Aneurysm Father     Review of Systems: General: negative for  chills, fever, night sweats or weight changes.  Cardiovascular: negative for dyspnea on exertion, edema, orthopnea, palpitations, paroxysmal nocturnal dyspnea Dermatological: negative for rash Respiratory: positive for shortness of breath, cough, negative for wheezing Urologic: negative for hematuria Abdominal: negative for nausea, vomiting, diarrhea, bright red blood per rectum, melena, or frank hematemesis Neurologic: negative for visual changes, syncope, or dizziness All other systems reviewed and are otherwise negative except as noted above.  PHYSICAL EXAM: VS:  BP 148/84  Pulse 78  Ht 5\' 5"  (1.651 m)  Wt 144 lb 12 oz (65.658 kg)  BMI 24.09 kg/m2  Well nourished, well developed, in no acute distress HEENT: normal, PERRL Neck: no JVD or bruits Cardiac:  normal S1, S2; RRR; no murmur or gallops Lungs: distant breath sounds,  scattered exp wheezing diffusely, no rales or rhonchi Abd: soft, nontender, no hepatomegaly, normoactive BS x 4 quads Ext: + L femoral bruit, trace L DP/PT pulse, 2+ DP/PT pulses on the R, + clubbing, sparse hair distribution to bilateral LEs, no cyanosis Skin: warm and dry, cap refill < 2 sec Neuro:  CNs 2-12 intact, no focal abnormalities noted Musculoskeletal: strength and tone appropriate for age  Psych: normal affect, anxious-appearing

## 2012-10-22 NOTE — Assessment & Plan Note (Signed)
He reports two episodes of sharp, R sided chest pain aggravated by coughing. No relation to exertion. No associated symptoms. No radiation. EKG today w/o ischemic changes. Unchanged from 06/2012 tracing. Suspect pleuritic etiology. Have provided with NTG SL PRN should he develop anginal discomfort. Characteristic angina was described to the patient in detail today so that he will aware if this develops in the future.

## 2012-10-22 NOTE — Assessment & Plan Note (Signed)
Continues to smoke 3 cigs/day. Anxiety is largely contributing to his tobacco abuse. He is unwilling to quit today as he is considerably anxious about his upcoming GI work-up. He states he is willing to quit afterwards.

## 2012-10-23 ENCOUNTER — Ambulatory Visit: Payer: Self-pay | Admitting: Internal Medicine

## 2012-10-23 LAB — CANCER CENTER HEMOGLOBIN: HGB: 13 g/dL (ref 13.0–18.0)

## 2012-10-25 ENCOUNTER — Ambulatory Visit: Payer: Self-pay | Admitting: Unknown Physician Specialty

## 2012-10-30 LAB — PATHOLOGY REPORT

## 2012-10-31 LAB — FERRITIN: Ferritin (ARMC): 27 ng/mL (ref 8–388)

## 2012-11-04 ENCOUNTER — Other Ambulatory Visit: Payer: Self-pay | Admitting: *Deleted

## 2012-11-04 MED ORDER — ATORVASTATIN CALCIUM 20 MG PO TABS: 20.0000 mg | ORAL_TABLET | Freq: Every day | ORAL | Status: DC

## 2012-11-04 NOTE — Telephone Encounter (Signed)
Refilled Atorvastatin sent to [facility] pharmacy.

## 2012-11-06 ENCOUNTER — Encounter (INDEPENDENT_AMBULATORY_CARE_PROVIDER_SITE_OTHER): Payer: No Typology Code available for payment source

## 2012-11-06 DIAGNOSIS — I739 Peripheral vascular disease, unspecified: Secondary | ICD-10-CM

## 2012-11-06 DIAGNOSIS — I714 Abdominal aortic aneurysm, without rupture: Secondary | ICD-10-CM

## 2012-11-06 DIAGNOSIS — I700 Atherosclerosis of aorta: Secondary | ICD-10-CM

## 2012-11-07 ENCOUNTER — Telehealth: Payer: Self-pay | Admitting: *Deleted

## 2012-11-07 LAB — CANCER CENTER HEMOGLOBIN: HGB: 12.7 g/dL — ABNORMAL LOW (ref 13.0–18.0)

## 2012-11-07 NOTE — Telephone Encounter (Signed)
He had a drug eluting stent to his heart in 06/2012. He has to stay on Plavix for 12 months. If the biopsy is urgent, then we might accept 6 months. It depends how urgent the biopsy is.

## 2012-11-07 NOTE — Telephone Encounter (Signed)
Patient has to have a throat biopsy needs to know how long to be off his blood thinner. Please advise

## 2012-11-07 NOTE — Telephone Encounter (Signed)
I called Dr. Allene Pyo office and spoke with his nurse. I advised her of Dr. Jari Sportsman recommendations. She will discuss with Dr. Ezzard Standing and call us back.

## 2012-11-07 NOTE — Telephone Encounter (Signed)
I spoke with the patient. He is pending a throat biopsy with Dr. Narda Bonds (ENT) in [place]. Phone- [phone]. Confirmed he is not on aspirin, but he is taking plavix. Will forward to Dr. Kirke Corin for review. We will call the patient back with recommendations. He is agreeable.

## 2012-11-07 NOTE — Telephone Encounter (Signed)
Call received from Susie at Dr. Allene Pyo office. Per Dr. Ezzard Standing, he will proceed with biopsy with the patient on plavix. I have called the patient and notified him of this.

## 2012-11-11 ENCOUNTER — Telehealth: Payer: Self-pay

## 2012-11-11 NOTE — Telephone Encounter (Signed)
Message copied by Marilynne Halsted on Mon Nov 11, 2012  1:46 PM ------      Message from: Larry Walters      Created: Fri Nov 08, 2012  5:38 PM       Patent stent in right leg and normal ABI. ------

## 2012-11-11 NOTE — Telephone Encounter (Signed)
Spoke w/ pt.  She is aware of results.  

## 2012-11-12 ENCOUNTER — Other Ambulatory Visit: Payer: Self-pay | Admitting: Otolaryngology

## 2012-11-12 NOTE — H&P (Signed)
PREOPERATIVE H&P  Chief Complaint: chronic sore throat  HPI: Larry Walters is a 62 y.o. male who presents for evaluation of chronic sore throat and abnormality noted on the epiglottis following recent upper endoscopy by Dr. Mechele Collin. Patient has history of significant COPD on home O2 therapy as well as CAD status post recent stent placed in June. He's on plavix and has been treated for thrush. On FOL in the office patient appears to have an abnormality on the laryngeal surface of the epiglottis and needs DL and bx to rule out cancer.   Past Medical History  Diagnosis Date  . COPD (chronic obstructive pulmonary disease)   . Mold exposure   . Tobacco dependency   . Anxiety   . Hypercholesteremia   . COPD (chronic obstructive pulmonary disease)   . Hypertension   . Peripheral vascular disease     Angiography in August of 2013 showed patent aortoiliac stent graft with no significant leaks, 80% stenosis in the right common iliac artery just distal to the stent. He had a successful self-expanding stent placement.  . History of multiple pulmonary nodules   . Depression   . AAA (abdominal aortic aneurysm)     s/p endovascular repair by Dr. Wyn Quaker in 01/2011  . Gallstones   . Myocardial infarction   . Coronary artery disease 08/2011    Cardiac cath: Occluded RCA with good left-to-right collaterals, 50% proximal LAD stenosis. Repeat cardiac catheterization in June of 2014 due to a worsening exertional dyspnea: 60% proximal LAD stenosis with FFR ratio of 0. 78. He underwent successful drug-eluting stent placement to the proximal LAD with a 3.0 x 15 mm Xience stent  . Iron deficiency    Past Surgical History  Procedure Laterality Date  . Cataract extraction  12/20/2010    left eye  . Vein surgery  02/10/2011  . Resection of fatty tumor      back  . Angioplasty / stenting iliac  2013  . Cardiac catheterization  09/13/2011    [facility] iliac artery stent placement  . Abdominal aortic  aneurysm repair      Stent graft done by Dr. dew.  . Cholecystectomy N/A 05/26/2012    Procedure: LAPAROSCOPIC CHOLECYSTECTOMY WITH INTRAOPERATIVE CHOLANGIOGRAM;  Surgeon: Cherylynn Ridges, MD;  Location: [facility] OR;  Service: General;  Laterality: N/A;  . Cardiac catheterization  6/14    [facility];60% stent in the proximal LAD;   History   Social History  . Marital Status: Married    Spouse Name: N/A    Number of Children: 1  . Years of Education: N/A   Occupational History  . Maintenence work    Social History Main Topics  . Smoking status: Current Every Day Smoker -- 0.30 packs/day for 35 years    Types: Cigarettes  . Smokeless tobacco: Never Used  . Alcohol Use: No  . Drug Use: No  . Sexual Activity: Not on file   Other Topics Concern  . Not on file   Social History Narrative  . No narrative on file   Family History  Problem Relation Age of Onset  . COPD Mother   . Aneurysm Father    Allergies  Allergen Reactions  . Citalopram     Celexa= "felt drunk"  . Mirtazapine     Remeron= "felt drunk:  Marland Kitchen Sertraline Hcl     Zoloft= "felt drunk"  . Penicillins Hives and Rash   Prior to Admission medications   Medication Sig Start Date End Date  Taking? Authorizing Provider  Acetylcysteine 600 MG CAPS Take by mouth 2 (two) times daily.    Historical Provider, MD  albuterol (PROVENTIL HFA;VENTOLIN HFA) 108 (90 BASE) MCG/ACT inhaler Inhale 2 puffs into the lungs every 6 (six) hours as needed for wheezing.    Historical Provider, MD  albuterol (PROVENTIL) (2.5 MG/3ML) 0.083% nebulizer solution Take 2.5 mg by nebulization every 6 (six) hours as needed for wheezing.    Historical Provider, MD  aspirin 81 MG tablet Take 81 mg by mouth daily.    Historical Provider, MD  atorvastatin (LIPITOR) 20 MG tablet Take 1 tablet (20 mg total) by mouth daily. 11/04/12 11/04/13  Roger A Arguello, PA-C  clopidogrel (PLAVIX) 75 MG tablet Take 1 tablet (75 mg total) by mouth daily. 10/07/12 10/07/13  Iran Ouch, MD  diltiazem (CARDIZEM CD) 120 MG 24 hr capsule Take 1 capsule (120 mg total) by mouth daily. 03/28/12   Iran Ouch, MD  Fluticasone-Salmeterol (ADVAIR) 250-50 MCG/DOSE AEPB Inhale 1 puff into the lungs every 12 (twelve) hours.    Historical Provider, MD  KLOR-CON M10 10 MEQ tablet Take 10 mEq by mouth daily.  09/21/12   Historical Provider, MD  Morphine Sulfate (MORPHINE CONCENTRATE) 10 mg / 0.5 ml concentrated solution Take by mouth as needed for pain.    Historical Provider, MD  nitroGLYCERIN (NITROSTAT) 0.4 MG SL tablet Place 1 tablet (0.4 mg total) under the tongue every 5 (five) minutes as needed for chest pain. 10/22/12   Roger A Arguello, PA-C  NON FORMULARY Oxygen 2 liters daily at bedtime.    Historical Provider, MD  predniSONE (DELTASONE) 10 MG tablet Take 20 mg by mouth daily.  10/07/12   Historical Provider, MD  QUETIAPINE FUMARATE PO Take 25 mg by mouth daily.    Historical Provider, MD  roflumilast (DALIRESP) 500 MCG TABS tablet Take 1 tablet (500 mcg total) by mouth daily. 05/17/12   Lupita Leash, MD  tiotropium (SPIRIVA) 18 MCG inhalation capsule Place 18 mcg into inhaler and inhale daily.    Historical Provider, MD  traMADol (ULTRAM) 50 MG tablet Take 50 mg by mouth daily.  10/21/12   Historical Provider, MD  triamcinolone cream (KENALOG) 0.1 %  06/01/12   Historical Provider, MD     Positive ROS: chronic sore throat for 4 weeks more on the left side  All other systems have been reviewed and were otherwise negative with the exception of those mentioned in the HPI and as above.  Physical Exam: There were no vitals filed for this visit.  General: Alert, no acute distress Oral: Normal oral mucosa and tonsils Nasal: Clear nasal passages   FOL thru right nostril reveals ulcer or granulation tissue on the laryngeal surface of the epiglottis, VCs are clear. Neck: No palpable adenopathy or thyroid nodules Ear: Ear canal is clear with normal appearing  TMs Cardiovascular: Regular rate and rhythm, no murmur.  Respiratory: Clear to auscultation Neurologic: Alert and oriented x 3   Assessment/Plan: dysphagia Plan for Procedure(s): DIRECT LARYNGOSCOPY AND BIOPSY   Dillard Cannon, MD 11/12/2012 4:48 PM

## 2012-11-14 ENCOUNTER — Encounter (HOSPITAL_COMMUNITY)
Admission: RE | Admit: 2012-11-14 | Discharge: 2012-11-14 | Disposition: A | Discharge status: Home / Self Care | Payer: No Typology Code available for payment source | Source: Ambulatory Visit | Attending: Otolaryngology | Admitting: Otolaryngology

## 2012-11-14 ENCOUNTER — Encounter (HOSPITAL_COMMUNITY): Payer: Self-pay

## 2012-11-14 ENCOUNTER — Other Ambulatory Visit: Payer: Self-pay

## 2012-11-14 ENCOUNTER — Ambulatory Visit (HOSPITAL_COMMUNITY)
Admission: RE | Admit: 2012-11-14 | Discharge: 2012-11-14 | Disposition: A | Discharge status: Home / Self Care | Payer: No Typology Code available for payment source | Source: Ambulatory Visit | Attending: Vascular Surgery | Admitting: Vascular Surgery

## 2012-11-14 DIAGNOSIS — Z9981 Dependence on supplemental oxygen: Secondary | ICD-10-CM | POA: Insufficient documentation

## 2012-11-14 DIAGNOSIS — J438 Other emphysema: Secondary | ICD-10-CM | POA: Insufficient documentation

## 2012-11-14 DIAGNOSIS — I2510 Atherosclerotic heart disease of native coronary artery without angina pectoris: Secondary | ICD-10-CM | POA: Insufficient documentation

## 2012-11-14 DIAGNOSIS — R0602 Shortness of breath: Secondary | ICD-10-CM | POA: Insufficient documentation

## 2012-11-14 HISTORY — DX: Cardiac murmur, unspecified: R01.1

## 2012-11-14 HISTORY — DX: Anemia, unspecified: D64.9

## 2012-11-14 LAB — BASIC METABOLIC PANEL
BUN: 11 mg/dL (ref 6–23)
Calcium: 9.6 mg/dL (ref 8.4–10.5)
Creatinine, Ser: 0.84 mg/dL (ref 0.50–1.35)
GFR calc non Af Amer: >90 mL/min (ref >90)
Glucose, Bld: 110 mg/dL — ABNORMAL HIGH (ref 70–99)
Potassium: 3.3 mEq/L — ABNORMAL LOW (ref 3.5–5.1)
Sodium: 141 mEq/L (ref 135–145)

## 2012-11-14 LAB — CBC
HCT: 42.4 % (ref 39.0–52.0)
Hemoglobin: 13.3 g/dL (ref 13.0–17.0)
MCH: 26.8 pg (ref 26.0–34.0)
MCHC: 31.4 g/dL (ref 30.0–36.0)
Platelets: 320 10*3/uL (ref 150–400)
RDW: 17.9 % — ABNORMAL HIGH (ref 11.5–15.5)

## 2012-11-14 MED ORDER — ATORVASTATIN CALCIUM 20 MG PO TABS: 20.0000 mg | ORAL_TABLET | Freq: Every day | ORAL | Status: AC

## 2012-11-14 MED ORDER — CLOPIDOGREL BISULFATE 75 MG PO TABS: 75.0000 mg | ORAL_TABLET | Freq: Every day | ORAL | Status: AC

## 2012-11-14 MED ORDER — DILTIAZEM HCL ER COATED BEADS 120 MG PO CP24: 120.0000 mg | ORAL_CAPSULE | Freq: Every day | ORAL | Status: AC

## 2012-11-14 NOTE — Progress Notes (Addendum)
Pt PCP is Dr.  Benancio Deeds at [facility] Cardiologist is Dr Kirke Corin Lung Dr is Dr Mayo Ao Cancer Dr is Dr Neale Burly at [facility] states that he has several blood transfusions and iron infusions in the past, and that he is loosing blood form some where.  Pt request to speak to Revonda Standard about concerns related to his breathing.  Revonda Standard called and will speak with pt.  Voices understanding of pre-admit instructions.

## 2012-11-14 NOTE — Progress Notes (Signed)
Anesthesia PAT Evaluation:  Patient is a 62 year old male scheduled for direct larnygoscopy and biopsy for evaluation of chronic sore throat and epiglottic lesion on recent endoscopy on 11/15/12 by ENT Dr. Dillard Cannon [phone]).   History includes smoking, COPD with night time and PRN home oxygen use @ 2L/[place], mold exposure, HTN, anxiety, depression, PAD s/p right CIA stent 08/2011, AAA s/p EVAR 01/2011 by Dr. Wyn Quaker, CAD s/p LAD DES 06/2012 [facility] iron deficiency anemia requiring transfusion and IV iron, cholecystectomy 05/26/12.  He reports recent EGD and colonoscopy that did not reveal a specific source of blood loss.   PCP was reported as Dr. Benancio Deeds at [facility].  Cardiologist is Dr. Kirke Corin who was notified of plans for surgery and recommended that patient stay of Plavix for 12 months, so Dr. Ezzard Standing will proceed with surgery with patient still on Plavix.  GI is Dr. Markham Jordan. Pulmonologist is Dr. Meredeth Ide, records are pending.  Hematologist is Dr. Neale Burly.    He reports that he was referred to [facility] for lung transplant in the past, but was turned down due to CAD and anti-platelet therapy history.  He also continues to smoke 3-4 cig/day. His breathing has worsened over the past month or so.  He can not get out of bed without being SOB.  He is having more difficultly clearing secretions.  He denies any URI symptoms or fever.  He has had more back pain.  He denies chest pain.  Exam shows a pleasant Caucasian male.  He does not appear in any acute distress at rest, but does become dyspneic with minimal exertion. O2 sat on RA was 93%.  He has bilateral expiratory wheezing anteriorly and posteriorly at the left base.  Heart sounds are difficult to auscultate due to wheezing, but pulse is regular.   Echo on 07/02/12 showed: - Left ventricle: The cavity size was normal. Systolic function was normal. The estimated ejection fraction was in the range of 55% to 65%. Wall motion was normal; there  were no regional wall motion abnormalities. Doppler parameters are consistent with abnormal left ventricular relaxation (grade 1 diastolic dysfunction). - Left atrium: The atrium was normal in size. - Right ventricle: Systolic function was normal. - Pulmonary arteries: Systolic pressure was within the normal range. - Trivial mitral and mild tricuspid regurgitation. Impressions: Challenging image quality secondary to COPD.  Cardiac cath on 07/09/12 showed significant 2V CAD, known chronically occluded RCA with excellent left to right collaterals.  60% stenosis in the proximal LAD s/p DES.    EKG on 10/22/12 showed NSR, right BBB.  Labs showed leukocytosis with a WBC of 23.4K.  He is on Prednisone, although I'm unsure if it is contributing. H/H 13.3/42.4.  Cr 0.84. Glucose 110.  2V CXR radiology report on 11/14/12 showed stable emphysematous changes but no acute pulmonary findings.  I called and spoke with Dr. Ezzard Standing about symptoms and leukocytosis, as I was unsure if his symptoms represented more of a COPD exacerbation or PNA, or if his epiglottic lesion had any obstructive characteristics.  He said that patient's epiglottic lesion was a non-obstructing, non-bulky lesion.  He saw patient just eight days ago and patient reported symptoms have been stable since then. Dr. Ezzard Standing recommended contacting Dr. Meredeth Ide to see if patient could be evaluated today to see if he felt surgery would need to be postponed due to possible COPD exacerbation. I also discussed with anesthesiologist Dr. Randa Evens. I called and spoke with Arline Asp at Dr. Verdie Shire' office.  They will see patient this afternoon.  Patient was notified to leave straight from [facility] and go to Dr. Verdie Shire' office. I gave Dr. Allene Pyo phone number to [facility], so he could be contacted if surgery should be delayed.    Velna Ochs [facility] Short Stay Center/Anesthesiology Phone [phone] 11/14/2012 2:35 PM

## 2012-11-14 NOTE — Pre-Procedure Instructions (Signed)
Damarcus Reggio  11/14/2012   Your procedure is scheduled on:  11-15-12 @0900   Report to [facility]  2 * 3 at 0700 AM.  Call this number if you have problems the morning of surgery: [phone]   Remember:   Do not eat food or drink liquids after midnight.   Take these medicines the morning of surgery with A SIP OF WATER: Inhalers if needed, Cardizem (diltiazem) and pain medication if needed.   Stop taking Aspirin, aleve, ibuprofen, BC's, Goody's, Herbal medications, and Fish Oil.  Do not wear jewelry, make-up or nail polish.  Do not wear lotions, powders, or perfumes. You may wear deodorant.  Do not shave 48 hours prior to surgery. Men may shave face and neck.  Do not bring valuables to the hospital.  [facility] is not responsible                  for any belongings or valuables.               Contacts, dentures or bridgework may not be worn into surgery.  Leave suitcase in the car. After surgery it may be brought to your room.  For patients admitted to the hospital, discharge time is determined by your                treatment team.               Patients discharged the day of surgery will not be allowed to drive  home.    Special Instructions: Shower using CHG 2 nights before surgery and the night before surgery.  If you shower the day of surgery use CHG.  Use special wash - you have one bottle of CHG for all showers.  You should use approximately 1/3 of the bottle for each shower.   Please read over the following fact sheets that you were given: Pain Booklet, Coughing and Deep Breathing and Surgical Site Infection Prevention

## 2012-11-15 ENCOUNTER — Encounter (HOSPITAL_COMMUNITY): Payer: Self-pay | Admitting: *Deleted

## 2012-11-15 ENCOUNTER — Encounter (HOSPITAL_COMMUNITY)
Admission: RE | Disposition: A | Discharge status: Home / Self Care | Payer: Self-pay | Source: Ambulatory Visit | Attending: Otolaryngology

## 2012-11-15 ENCOUNTER — Ambulatory Visit (HOSPITAL_COMMUNITY): Payer: No Typology Code available for payment source | Admitting: Anesthesiology

## 2012-11-15 ENCOUNTER — Encounter (HOSPITAL_COMMUNITY): Payer: No Typology Code available for payment source | Admitting: Vascular Surgery

## 2012-11-15 ENCOUNTER — Ambulatory Visit (HOSPITAL_COMMUNITY)
Admission: RE | Admit: 2012-11-15 | Discharge: 2012-11-15 | Disposition: A | Discharge status: Home / Self Care | Payer: No Typology Code available for payment source | Source: Ambulatory Visit | Attending: Otolaryngology | Admitting: Otolaryngology

## 2012-11-15 DIAGNOSIS — E7800 Pure hypercholesterolemia, unspecified: Secondary | ICD-10-CM | POA: Insufficient documentation

## 2012-11-15 DIAGNOSIS — Z9981 Dependence on supplemental oxygen: Secondary | ICD-10-CM | POA: Insufficient documentation

## 2012-11-15 DIAGNOSIS — I739 Peripheral vascular disease, unspecified: Secondary | ICD-10-CM | POA: Insufficient documentation

## 2012-11-15 DIAGNOSIS — J312 Chronic pharyngitis: Secondary | ICD-10-CM | POA: Insufficient documentation

## 2012-11-15 DIAGNOSIS — F411 Generalized anxiety disorder: Secondary | ICD-10-CM | POA: Insufficient documentation

## 2012-11-15 DIAGNOSIS — I252 Old myocardial infarction: Secondary | ICD-10-CM | POA: Insufficient documentation

## 2012-11-15 DIAGNOSIS — R1310 Dysphagia, unspecified: Secondary | ICD-10-CM | POA: Insufficient documentation

## 2012-11-15 DIAGNOSIS — F17200 Nicotine dependence, unspecified, uncomplicated: Secondary | ICD-10-CM | POA: Insufficient documentation

## 2012-11-15 DIAGNOSIS — J4489 Other specified chronic obstructive pulmonary disease: Secondary | ICD-10-CM | POA: Insufficient documentation

## 2012-11-15 DIAGNOSIS — C321 Malignant neoplasm of supraglottis: Secondary | ICD-10-CM | POA: Insufficient documentation

## 2012-11-15 DIAGNOSIS — J449 Chronic obstructive pulmonary disease, unspecified: Secondary | ICD-10-CM | POA: Insufficient documentation

## 2012-11-15 DIAGNOSIS — I10 Essential (primary) hypertension: Secondary | ICD-10-CM | POA: Insufficient documentation

## 2012-11-15 DIAGNOSIS — I2510 Atherosclerotic heart disease of native coronary artery without angina pectoris: Secondary | ICD-10-CM | POA: Insufficient documentation

## 2012-11-15 HISTORY — PX: DIRECT LARYNGOSCOPY: SHX5326

## 2012-11-15 LAB — SURGICAL PCR SCREEN: MRSA, PCR: POSITIVE — AB

## 2012-11-15 SURGERY — LARYNGOSCOPY, DIRECT
Anesthesia: General

## 2012-11-15 MED ORDER — MUPIROCIN 2 % EX OINT
TOPICAL_OINTMENT | CUTANEOUS | Status: AC
  Administered 2012-11-15: 1
  Filled 2012-11-15: qty 22

## 2012-11-15 MED ORDER — LACTATED RINGERS IV SOLN: INTRAVENOUS | Status: DC

## 2012-11-15 MED ORDER — FENTANYL CITRATE 0.05 MG/ML IJ SOLN
INTRAMUSCULAR | Status: DC | PRN
  Administered 2012-11-15: 50 ug via INTRAVENOUS

## 2012-11-15 MED ORDER — OXYMETAZOLINE HCL 0.05 % NA SOLN
NASAL | Status: DC | PRN
  Administered 2012-11-15: 1 via NASAL

## 2012-11-15 MED ORDER — PROPOFOL 10 MG/ML IV BOLUS
INTRAVENOUS | Status: DC | PRN
  Administered 2012-11-15: 150 mg via INTRAVENOUS

## 2012-11-15 MED ORDER — MIDAZOLAM HCL 5 MG/5ML IJ SOLN
INTRAMUSCULAR | Status: DC | PRN
  Administered 2012-11-15: .5 mg via INTRAVENOUS

## 2012-11-15 MED ORDER — OXYMETAZOLINE HCL 0.05 % NA SOLN
NASAL | Status: AC
  Filled 2012-11-15: qty 15

## 2012-11-15 MED ORDER — SODIUM CHLORIDE 0.9 % IR SOLN
Status: DC | PRN
  Administered 2012-11-15: 1000 mL

## 2012-11-15 MED ORDER — LACTATED RINGERS IV SOLN
INTRAVENOUS | Status: DC | PRN
  Administered 2012-11-15: 09:00:00 via INTRAVENOUS

## 2012-11-15 MED ORDER — LIDOCAINE HCL (CARDIAC) 20 MG/ML IV SOLN
INTRAVENOUS | Status: DC | PRN
  Administered 2012-11-15: 60 mg via INTRAVENOUS

## 2012-11-15 MED ORDER — PHENYLEPHRINE HCL 10 MG/ML IJ SOLN
10.0000 mg | INTRAVENOUS | Status: DC | PRN
  Administered 2012-11-15: 10 ug/min via INTRAVENOUS

## 2012-11-15 MED ORDER — EPINEPHRINE HCL (NASAL) 0.1 % NA SOLN
NASAL | Status: AC
  Filled 2012-11-15: qty 30

## 2012-11-15 MED ORDER — SUCCINYLCHOLINE CHLORIDE 20 MG/ML IJ SOLN
INTRAMUSCULAR | Status: DC | PRN
  Administered 2012-11-15: 40 mg via INTRAVENOUS

## 2012-11-15 MED ORDER — EPINEPHRINE HCL 1 MG/ML IJ SOLN
INTRAMUSCULAR | Status: DC | PRN
  Administered 2012-11-15: 1 mg via ENDOTRACHEOPULMONARY

## 2012-11-15 SURGICAL SUPPLY — 27 items
BALLN PULM 15 16.5 18X75 (BALLOONS)
BALLOON PULM 15 16.5 18X75 (BALLOONS) IMPLANT
CANISTER SUCTION 2500CC (MISCELLANEOUS) ×2 IMPLANT
CLOTH BEACON ORANGE TIMEOUT ST (SAFETY) ×2 IMPLANT
CONT SPEC 4OZ CLIKSEAL STRL BL (MISCELLANEOUS) ×2 IMPLANT
COVER TABLE BACK 60X90 (DRAPES) ×4 IMPLANT
DRAPE PROXIMA HALF (DRAPES) ×2 IMPLANT
GLOVE SS BIOGEL STRL SZ 7.5 (GLOVE) ×1 IMPLANT
GLOVE SUPERSENSE BIOGEL SZ 7.5 (GLOVE) ×1
GUARD TEETH (MISCELLANEOUS) IMPLANT
KIT BASIN OR (CUSTOM PROCEDURE TRAY) ×2 IMPLANT
KIT ROOM TURNOVER OR (KITS) ×2 IMPLANT
MARKER SKIN DUAL TIP RULER LAB (MISCELLANEOUS) IMPLANT
NS IRRIG 1000ML POUR BTL (IV SOLUTION) ×2 IMPLANT
PAD ARMBOARD 7.5X6 YLW CONV (MISCELLANEOUS) ×4 IMPLANT
PAD EYE OVAL STERILE LF (GAUZE/BANDAGES/DRESSINGS) IMPLANT
PATTIES SURGICAL .5 X3 (DISPOSABLE) IMPLANT
SOLUTION ANTI FOG 6CC (MISCELLANEOUS) IMPLANT
SPECIMEN JAR SMALL (MISCELLANEOUS) ×2 IMPLANT
SPONGE GAUZE 4X4 12PLY (GAUZE/BANDAGES/DRESSINGS) ×2 IMPLANT
SPONGE INTESTINAL PEANUT (DISPOSABLE) IMPLANT
SURGILUBE 2OZ TUBE FLIPTOP (MISCELLANEOUS) ×2 IMPLANT
SYR INFLATE BILIARY GAUGE (MISCELLANEOUS) IMPLANT
TOWEL OR 17X24 6PK STRL BLUE (TOWEL DISPOSABLE) ×4 IMPLANT
TRAP SPECIMEN MUCOUS 40CC (MISCELLANEOUS) IMPLANT
TUBE CONNECTING 12X1/4 (SUCTIONS) ×2 IMPLANT
WATER STERILE IRR 1000ML POUR (IV SOLUTION) ×2 IMPLANT

## 2012-11-15 NOTE — Interval H&P Note (Signed)
History and Physical Interval Note:  11/15/2012 9:47 AM  Larry Walters  has presented today for surgery, with the diagnosis of dysphagia  The various methods of treatment have been discussed with the patient and family. After consideration of risks, benefits and other options for treatment, the patient has consented to  Procedure(s): DIRECT LARYNGOSCOPY AND BIOPSY (N/A) as a surgical intervention .  The patient's history has been reviewed, patient examined, no change in status, stable for surgery.  I have reviewed the patient's chart and labs.  Questions were answered to the patient's satisfaction.     NEWMAN, CHRISTOPHER

## 2012-11-15 NOTE — Anesthesia Preprocedure Evaluation (Signed)
Anesthesia Evaluation  Patient identified by MRN, date of birth, ID band Patient awake    Reviewed: Allergy & Precautions, H&P , NPO status , Patient's Chart, lab work & pertinent test results  History of Anesthesia Complications Negative for: history of anesthetic complications  Airway Mallampati: II TM Distance: >3 FB Neck ROM: Full    Dental  (+) Dental Advisory Given   Pulmonary shortness of breath, with exertion and Long-Term Oxygen Therapy, COPD COPD inhaler and oxygen dependent,          Cardiovascular hypertension, + CAD, + Past MI and + Peripheral Vascular Disease     Neuro/Psych    GI/Hepatic negative GI ROS, Neg liver ROS,   Endo/Other  negative endocrine ROS  Renal/GU negative Renal ROS     Musculoskeletal   Abdominal   Peds  Hematology   Anesthesia Other Findings   Reproductive/Obstetrics                           Anesthesia Physical Anesthesia Plan  ASA: III  Anesthesia Plan: General   Post-op Pain Management:    Induction: Intravenous, Rapid sequence and Cricoid pressure planned  Airway Management Planned: Oral ETT  Additional Equipment:   Intra-op Plan:   Post-operative Plan: Extubation in OR and Possible Post-op intubation/ventilation  Informed Consent:   Dental advisory given  Plan Discussed with: CRNA, Anesthesiologist and Surgeon  Anesthesia Plan Comments:         Anesthesia Quick Evaluation

## 2012-11-15 NOTE — Anesthesia Procedure Notes (Addendum)
Procedure Name: Intubation Date/Time: 11/15/2012 10:06 AM Performed by: Armandina Gemma Pre-anesthesia Checklist: Patient identified, Timeout performed, Emergency Drugs available, Suction available and Patient being monitored Patient Re-evaluated:Patient Re-evaluated prior to inductionOxygen Delivery Method: Circle system utilized Preoxygenation: Pre-oxygenation with 100% oxygen Intubation Type: IV induction Ventilation: Mask ventilation without difficulty Laryngoscope Size: Miller and 2 Grade View: Grade I Tube type: Oral Tube size: 6.0 mm Number of attempts: 1 Airway Equipment and Method: Stylet Placement Confirmation: ETT inserted through vocal cords under direct vision,  breath sounds checked- equal and bilateral and positive ETCO2 Secured at: 22 cm Tube secured with: Tape Dental Injury: Teeth and Oropharynx as per pre-operative assessment  Comments: IV induction Smith- Intubation AM CRNA- atraumatic- mouth as pre op no teeth

## 2012-11-15 NOTE — Preoperative (Signed)
Beta Blockers   Reason not to administer Beta Blockers:Not Applicable 

## 2012-11-15 NOTE — Transfer of Care (Signed)
Immediate Anesthesia Transfer of Care Note  Patient: Larry Walters  Procedure(s) Performed: Procedure(s): DIRECT LARYNGOSCOPY AND BIOPSY (N/A)  Patient Location: PACU  Anesthesia Type:General  Level of Consciousness: awake, alert  and oriented  Airway & Oxygen Therapy: Patient Spontanous Breathing and Patient connected to nasal cannula oxygen  Post-op Assessment: Report given to PACU RN and Post -op Vital signs reviewed and stable  Post vital signs: Reviewed and stable  Complications: No apparent anesthesia complications

## 2012-11-15 NOTE — Op Note (Signed)
NAMEALDAN, CAMEY NO.:  [id number]  MEDICAL RECORD NO.:  [id number]  LOCATION:  [facility]                         FACILITY:  [facility]  PHYSICIAN:  Kristine Garbe. 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  OPERATION:  Direct laryngoscopy and biopsy.  SURGEON:  Kristine Garbe. Ezzard Standing, M.D.  ANESTHESIA:  General endotracheal.  COMPLICATIONS:  None.  BRIEF CLINICAL NOTE:  Toren Tucholski is a 62 year old gentleman, who was noted to have a lesion on his epiglottis initially when he underwent an endoscopy with Gastroenterology a little over a month ago because of problems with dysphagia.  Initially this lesion was thought to be possibly representing thrush, but despite treatment the thrush area cleared up, but he still seemed to have a lesion on the laryngeal surface of epiglottis, it was a little bit difficult to adequately visualize because of its dislocation, but it has persisted and he is taken to the operating room at this time for direct laryngoscopy and biopsy.  He has a significant history of COPD with wheezing.  Vocal cords appeared clear on fiberoptic laryngoscopy in the office, but again noted was an abnormal lesion on the laryngeal surface of epiglottis and he is taken to the operating room at this time for direct laryngoscopy and biopsy.  DESCRIPTION OF PROCEDURE:  After adequate anesthesia under endotracheal intubation, direct laryngoscopy was performed.  The base of the tongue and vallecula was clear.  Again the lesion was little bit adequate, difficult to visualize because of the thickened distal epiglottis of the laryngeal surface.  The true cords were clear.  Piriform sinuses were clear.  The laryngoscope was  suspended.  Pictures were obtained and then several biopsies were obtained from the abnormal lesion on the laryngeal surface of epiglottis.  Hemostasis was obtained with cotton pledgets soaked in adrenaline.  After taking biopsies, the procedure was completed and the patient was awoken from anesthesia and transferred to the recovery room postop doing well.  DISPOSITION:  The patient discharged home later this morning.  We will have him follow up in my office in 1 week to review pathology.          ______________________________ Kristine Garbe Ezzard Standing, M.D.     CEN/MEDQ  D:  11/15/2012  T:  11/15/2012  Job:  [id number]  cc:   [facility] Ned Clines, MD

## 2012-11-15 NOTE — Brief Op Note (Signed)
11/15/2012  11:04 AM  PATIENT:  Larry Walters  62 y.o. male  PRE-OPERATIVE DIAGNOSIS:  dysphagia  POST-OPERATIVE DIAGNOSIS:  * No post-op diagnosis entered *  PROCEDURE:  Procedure(s): DIRECT LARYNGOSCOPY AND BIOPSY (N/A)  SURGEON:  Surgeon(s) and Role:    * Drema Halon, MD - Primary  PHYSICIAN ASSISTANT:   ASSISTANTS: none   ANESTHESIA:   general  EBL:     BLOOD ADMINISTERED:none  DRAINS: none   LOCAL MEDICATIONS USED:  NONE  SPECIMEN:  Source of Specimen:  epiglottis laryngeal surface  DISPOSITION OF SPECIMEN:  PATHOLOGY  COUNTS:  YES  TOURNIQUET:  * No tourniquets in log *  DICTATION: .Other Dictation: Dictation Number [id number]  PLAN OF CARE: Discharge to home after PACU  PATIENT DISPOSITION:  PACU - hemodynamically stable.   Delay start of Pharmacological VTE agent (>24hrs) due to surgical blood loss or risk of bleeding: yes

## 2012-11-15 NOTE — Anesthesia Postprocedure Evaluation (Signed)
  Anesthesia Post-op Note  Patient: Designer, fashion/clothing  Procedure(s) Performed: Procedure(s): DIRECT LARYNGOSCOPY AND BIOPSY (N/A)  Patient Location: PACU  Anesthesia Type:General  Level of Consciousness: awake, alert , oriented and patient cooperative  Airway and Oxygen Therapy: Patient Spontanous Breathing  Post-op Pain: none  Post-op Assessment: Post-op Vital signs reviewed, Patient's Cardiovascular Status Stable, Respiratory Function Stable, Patent Airway, No signs of Nausea or vomiting and Pain level controlled  Post-op Vital Signs: stable  Complications: No apparent anesthesia complications

## 2012-11-19 ENCOUNTER — Encounter (HOSPITAL_COMMUNITY): Payer: Self-pay | Admitting: Otolaryngology

## 2012-11-20 LAB — CANCER CENTER HEMOGLOBIN: HGB: 12.5 g/dL — ABNORMAL LOW (ref 13.0–18.0)

## 2012-11-23 ENCOUNTER — Ambulatory Visit: Payer: Self-pay | Admitting: Internal Medicine

## 2012-11-28 ENCOUNTER — Other Ambulatory Visit: Payer: Self-pay

## 2012-12-02 LAB — CANCER CENTER HEMOGLOBIN: HGB: 12.1 g/dL — ABNORMAL LOW (ref 13.0–18.0)

## 2012-12-05 ENCOUNTER — Ambulatory Visit: Payer: Self-pay | Admitting: Radiation Oncology

## 2012-12-09 LAB — CBC CANCER CENTER
Basophil %: 0.5 %
HCT: 38.4 % — ABNORMAL LOW (ref 40.0–52.0)
HGB: 11.8 g/dL — ABNORMAL LOW (ref 13.0–18.0)
MCH: 25 pg — ABNORMAL LOW (ref 26.0–34.0)
MCHC: 30.7 g/dL — ABNORMAL LOW (ref 32.0–36.0)
Monocyte #: 0.8 x10 3/mm (ref 0.2–1.0)
Monocyte %: 4.7 %
Neutrophil #: 14.2 x10 3/mm — ABNORMAL HIGH (ref 1.4–6.5)
Neutrophil %: 89.7 %
Platelet: 339 x10 3/mm (ref 150–440)
RBC: 4.72 10*6/uL (ref 4.40–5.90)
WBC: 15.8 x10 3/mm — ABNORMAL HIGH (ref 3.8–10.6)

## 2012-12-23 ENCOUNTER — Ambulatory Visit: Payer: Self-pay | Admitting: Internal Medicine

## 2013-01-01 LAB — CBC CANCER CENTER
Basophil #: 0 x10 3/mm (ref 0.0–0.1)
Basophil %: 0.1 %
MCH: 25.7 pg — ABNORMAL LOW (ref 26.0–34.0)
Neutrophil #: 18.6 x10 3/mm — ABNORMAL HIGH (ref 1.4–6.5)
Neutrophil %: 92.9 %
Platelet: 311 x10 3/mm (ref 150–440)

## 2013-01-14 ENCOUNTER — Encounter: Payer: Self-pay | Admitting: Cardiovascular Disease

## 2013-01-14 ENCOUNTER — Ambulatory Visit (INDEPENDENT_AMBULATORY_CARE_PROVIDER_SITE_OTHER): Payer: Medicare Other | Admitting: Cardiovascular Disease

## 2013-01-14 VITALS — BP 119/76 | HR 105 | Ht 65.0 in | Wt 148.5 lb

## 2013-01-14 DIAGNOSIS — I714 Abdominal aortic aneurysm, without rupture: Secondary | ICD-10-CM

## 2013-01-14 DIAGNOSIS — I2510 Atherosclerotic heart disease of native coronary artery without angina pectoris: Secondary | ICD-10-CM

## 2013-01-14 DIAGNOSIS — I739 Peripheral vascular disease, unspecified: Secondary | ICD-10-CM

## 2013-01-14 NOTE — Patient Instructions (Signed)
Your physician recommends that you schedule a follow-up appointment in: 3 months. Your physician recommends that you continue on your current medications as directed. Please refer to the Current Medication list given to you today. 

## 2013-01-14 NOTE — Progress Notes (Signed)
HPI  This is a 62 year old man who is here today for a followup visit. The patient has extensive medical problems. He has severe COPD. 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 He had endovascular repair done in January of 2013 by Dr. Wyn Quaker and subsequent right common iliac artery stenting by me. He has sinus tachycardia related to COPD which improved significantly with the addition of diltiazem. He has known coronary artery disease with stenting of the proximal LAD in June of 2014.  The patient continued to have significant worsening of her COPD. He was also recently diagnosed with laryngeal cancer and started radiation therapy. This was stopped at his request due to worsening dysphagia. He is currently under hospice care due to his severe COPD.  Allergies  Allergen Reactions  . Citalopram     Celexa= "felt drunk"  . Mirtazapine     Remeron= "felt drunk:  Marland Kitchen Sertraline Hcl     Zoloft= "felt drunk"  . Penicillins Hives and Rash     Current Outpatient Prescriptions on File Prior to Visit  Medication Sig Dispense Refill  . albuterol (PROVENTIL HFA;VENTOLIN HFA) 108 (90 BASE) MCG/ACT inhaler Inhale 2 puffs into the lungs every 6 (six) hours as needed for wheezing.      Marland Kitchen albuterol (PROVENTIL) (2.5 MG/3ML) 0.083% nebulizer solution Take 2.5 mg by nebulization every 6 (six) hours as needed for wheezing.      Marland Kitchen aspirin 81 MG tablet Take 81 mg by mouth daily.      Marland Kitchen atorvastatin (LIPITOR) 20 MG tablet Take 1 tablet (20 mg total) by mouth daily.  90 tablet  3  . clopidogrel (PLAVIX) 75 MG tablet Take 1 tablet (75 mg total) by mouth daily.  90 tablet  3  . diltiazem (CARDIZEM CD) 120 MG 24 hr capsule Take 1 capsule (120 mg total) by mouth daily.  90 capsule  3  . Fluticasone-Salmeterol (ADVAIR) 500-50 MCG/DOSE AEPB Inhale 1 puff into the lungs every 12 (twelve) hours.      . Morphine Sulfate (MORPHINE CONCENTRATE) 10 mg / 0.5 ml concentrated solution  Take 40 mg by mouth every 2 (two) hours as needed for pain (or shortness of breath).       . nitroGLYCERIN (NITROSTAT) 0.4 MG SL tablet Place 1 tablet (0.4 mg total) under the tongue every 5 (five) minutes as needed for chest pain.  25 tablet  3  . predniSONE (DELTASONE) 10 MG tablet Take 20 mg by mouth daily.       . QUEtiapine (SEROQUEL) 25 MG tablet Take 25 mg by mouth at bedtime.      . roflumilast (DALIRESP) 500 MCG TABS tablet Take 1 tablet (500 mcg total) by mouth daily.  90 tablet  0  . tiotropium (SPIRIVA) 18 MCG inhalation capsule Place 18 mcg into inhaler and inhale daily.      . traMADol (ULTRAM) 50 MG tablet Take 50 mg by mouth daily.        No current facility-administered medications on file prior to visit.     Past Medical History  Diagnosis Date  . COPD (chronic obstructive pulmonary disease)   . Mold exposure   . Tobacco dependency   . Anxiety   . Hypercholesteremia   . COPD (chronic obstructive pulmonary disease)   . Hypertension   . Peripheral vascular disease     Angiography in August of 2013 showed patent aortoiliac stent graft with no significant leaks, 80% stenosis  in the right common iliac artery just distal to the stent. He had a successful self-expanding stent placement.  . History of multiple pulmonary nodules   . Depression   . AAA (abdominal aortic aneurysm)     s/p endovascular repair by Dr. Wyn Quaker in 01/2011  . Gallstones   . Myocardial infarction   . Coronary artery disease 08/2011    Cardiac cath: Occluded RCA with good left-to-right collaterals, 50% proximal LAD stenosis. Repeat cardiac catheterization in June of 2014 due to a worsening exertional dyspnea: 60% proximal LAD stenosis with FFR ratio of 0. 78. He underwent successful drug-eluting stent placement to the proximal LAD with a 3.0 x 15 mm Xience stent  . Iron deficiency   . Shortness of breath   . Heart murmur   . Anemia   . Throat cancer      Past Surgical History  Procedure Laterality  Date  . Cataract extraction  12/20/2010    left eye  . Vein surgery  02/10/2011  . Resection of fatty tumor      back  . Angioplasty / stenting iliac  2013  . Cardiac catheterization  09/13/2011    [facility] iliac artery stent placement  . Abdominal aortic aneurysm repair      Stent graft done by Dr. dew.  . Cholecystectomy N/A 05/26/2012    Procedure: LAPAROSCOPIC CHOLECYSTECTOMY WITH INTRAOPERATIVE CHOLANGIOGRAM;  Surgeon: Cherylynn Ridges, MD;  Location: [facility] OR;  Service: General;  Laterality: N/A;  . Cardiac catheterization  6/14    [facility];60% stent in the proximal LAD;  . Tonsillectomy    . Colonoscopy  2014  . Direct laryngoscopy N/A 11/15/2012    Procedure: DIRECT LARYNGOSCOPY AND BIOPSY;  Surgeon: Drema Halon, MD;  Location: [facility] OR;  Service: ENT;  Laterality: N/A;     Family History  Problem Relation Age of Onset  . COPD Mother   . Aneurysm Father      History   Social History  . Marital Status: Married    Spouse Name: N/A    Number of Children: 1  . Years of Education: N/A   Occupational History  . Maintenence work    Social History Main Topics  . Smoking status: Current Every Day Smoker -- 0.25 packs/day for 35 years    Types: Cigarettes  . Smokeless tobacco: Never Used  . Alcohol Use: No  . Drug Use: No  . Sexual Activity: Not on file   Other Topics Concern  . Not on file   Social History Narrative  . No narrative on file     PHYSICAL EXAM   BP 119/76  Pulse 105  Ht 5\' 5"  (1.651 m)  Wt 148 lb 8 oz (67.359 kg)  BMI 24.71 kg/m2  SpO2 94% Constitutional: He is oriented to person, place, and time. He appears well-developed and well-nourished. No distress.  HENT: No nasal discharge.  Head: Normocephalic and atraumatic.  Eyes: Pupils are equal and round. Right eye exhibits no discharge. Left eye exhibits no discharge.  Neck: Normal range of motion. Neck supple. No JVD present. No thyromegaly present.  Cardiovascular: Normal rate, regular  rhythm, normal heart sounds and. Exam reveals no gallop and no friction rub. No murmur heard.  Pulmonary/Chest: Effort normal and diminished breath sounds. No stridor. No respiratory distress. He has no wheezes. He has no rales. He exhibits no tenderness.  Abdominal: Soft. Bowel sounds are normal. He exhibits no distension. There is no tenderness. There is no rebound  and no guarding.  Musculoskeletal: Normal range of motion. He exhibits no edema and no tenderness.  Neurological: He is alert and oriented to person, place, and time. Coordination normal.  Skin: Skin is warm and dry. No rash noted. He is not diaphoretic. No erythema. No pallor.  Psychiatric: He has a normal mood and affect. His behavior is normal. Judgment and thought content normal.  Right groin:The femoral pulse is  normal. Distal pulses are also palpable     ASSESSMENT AND PLAN

## 2013-01-16 NOTE — Assessment & Plan Note (Signed)
Continue medical therapy. He had a drug-eluting stent placed to the LAD in June. If surgery is needed for his laryngeal cancer, Plavix can be discontinued 5-7 days before the surgery in January. I explained to him that ideally we like to continue this for 12 months after drug-eluting stent. However, if the surgery is life-saving, 6 months on dual antiplatelet therapy is acceptable.

## 2013-01-16 NOTE — Assessment & Plan Note (Signed)
Status post endovascular repair. No surveillance is recommended due to advanced COPD and active cancer.

## 2013-01-16 NOTE — Assessment & Plan Note (Signed)
He has no claudication at this time.

## 2013-01-20 LAB — CBC CANCER CENTER
Basophil #: 0.1 x10 3/mm (ref 0.0–0.1)
Basophil %: 1.3 %
Eosinophil #: 0.1 x10 3/mm (ref 0.0–0.7)
HCT: 40.2 % (ref 40.0–52.0)
HGB: 12.7 g/dL — ABNORMAL LOW (ref 13.0–18.0)
Lymphocyte #: 0.8 x10 3/mm — ABNORMAL LOW (ref 1.0–3.6)
Lymphocyte %: 7.6 %
MCHC: 31.6 g/dL — ABNORMAL LOW (ref 32.0–36.0)
Monocyte #: 0.8 x10 3/mm (ref 0.2–1.0)
Monocyte %: 8.1 %
Neutrophil #: 8.6 x10 3/mm — ABNORMAL HIGH (ref 1.4–6.5)
Neutrophil %: 82 %
RBC: 5.02 10*6/uL (ref 4.40–5.90)
RDW: 18.4 % — ABNORMAL HIGH (ref 11.5–14.5)
WBC: 10.5 x10 3/mm (ref 3.8–10.6)

## 2013-01-21 LAB — HEPATIC FUNCTION PANEL A (ARMC)
Albumin: 3.5 g/dL (ref 3.4–5.0)
Alkaline Phosphatase: 123 U/L — ABNORMAL HIGH
Bilirubin, Direct: 0.1 mg/dL (ref 0.00–0.20)
SGOT(AST): 12 U/L — ABNORMAL LOW (ref 15–37)
SGPT (ALT): 17 U/L (ref 12–78)
Total Protein: 7.2 g/dL (ref 6.4–8.2)

## 2013-01-21 LAB — CREATININE, SERUM
EGFR (African American): >60
EGFR (Non-African Amer.): >60

## 2013-01-23 ENCOUNTER — Ambulatory Visit: Payer: Self-pay | Admitting: Internal Medicine

## 2013-01-24 ENCOUNTER — Ambulatory Visit: Payer: Self-pay | Admitting: Internal Medicine

## 2013-01-29 LAB — CBC CANCER CENTER
BASOS ABS: 0.1 x10 3/mm (ref 0.0–0.1)
Basophil %: 1 %
Eosinophil #: 0.3 x10 3/mm (ref 0.0–0.7)
Eosinophil %: 3.5 %
HCT: 39.9 % — ABNORMAL LOW (ref 40.0–52.0)
HGB: 12.6 g/dL — AB (ref 13.0–18.0)
LYMPHS ABS: 0.7 x10 3/mm — AB (ref 1.0–3.6)
Lymphocyte %: 7.1 %
MCH: 25.5 pg — AB (ref 26.0–34.0)
MCHC: 31.7 g/dL — ABNORMAL LOW (ref 32.0–36.0)
MCV: 80 fL (ref 80–100)
MONOS PCT: 9.6 %
Monocyte #: 0.9 x10 3/mm (ref 0.2–1.0)
Neutrophil #: 7.3 x10 3/mm — ABNORMAL HIGH (ref 1.4–6.5)
Neutrophil %: 78.8 %
PLATELETS: 331 x10 3/mm (ref 150–440)
RBC: 4.97 10*6/uL (ref 4.40–5.90)
RDW: 18.5 % — ABNORMAL HIGH (ref 11.5–14.5)
WBC: 9.3 x10 3/mm (ref 3.8–10.6)

## 2013-02-04 ENCOUNTER — Inpatient Hospital Stay: Payer: Self-pay | Admitting: Internal Medicine

## 2013-02-04 LAB — CBC
HCT: 36.6 % — ABNORMAL LOW (ref 40.0–52.0)
HGB: 11.8 g/dL — AB (ref 13.0–18.0)
MCH: 25.3 pg — ABNORMAL LOW (ref 26.0–34.0)
MCHC: 32.1 g/dL (ref 32.0–36.0)
MCV: 79 fL — ABNORMAL LOW (ref 80–100)
Platelet: 316 10*3/uL (ref 150–440)
RBC: 4.65 10*6/uL (ref 4.40–5.90)
RDW: 19.1 % — ABNORMAL HIGH (ref 11.5–14.5)
WBC: 15.4 10*3/uL — ABNORMAL HIGH (ref 3.8–10.6)

## 2013-02-04 LAB — BASIC METABOLIC PANEL
Anion Gap: 8 (ref 7–16)
BUN: 6 mg/dL — AB (ref 7–18)
CALCIUM: 8.7 mg/dL (ref 8.5–10.1)
CREATININE: 0.82 mg/dL (ref 0.60–1.30)
Chloride: 102 mmol/L (ref 98–107)
Co2: 27 mmol/L (ref 21–32)
EGFR (African American): >60
EGFR (Non-African Amer.): >60
GLUCOSE: 108 mg/dL — AB (ref 65–99)
Osmolality: 272 (ref 275–301)
POTASSIUM: 2.8 mmol/L — AB (ref 3.5–5.1)
SODIUM: 137 mmol/L (ref 136–145)

## 2013-02-04 LAB — URINALYSIS, COMPLETE
BLOOD: NEGATIVE
Bacteria: NONE SEEN
Bilirubin,UR: NEGATIVE
Ketone: NEGATIVE
Leukocyte Esterase: NEGATIVE
NITRITE: NEGATIVE
PH: 5 (ref 4.5–8.0)
PROTEIN: NEGATIVE
SPECIFIC GRAVITY: 1.01 (ref 1.003–1.030)
SQUAMOUS EPITHELIAL: NONE SEEN
WBC UR: <1 /HPF (ref 0–5)

## 2013-02-04 LAB — TROPONIN I: Troponin-I: <0.02 ng/mL

## 2013-02-04 LAB — MAGNESIUM: MAGNESIUM: 1.9 mg/dL

## 2013-02-23 ENCOUNTER — Ambulatory Visit: Payer: Self-pay | Admitting: Internal Medicine

## 2013-02-23 DEATH — deceased

## 2013-02-24 ENCOUNTER — Telehealth: Payer: Self-pay

## 2013-02-24 NOTE — Telephone Encounter (Signed)
Patient's wife calling to let us know Mr. Larry Walters passed away.

## 2013-03-01 NOTE — Telephone Encounter (Signed)
i tried calling her but went to voice mail. Please offer my condolences.

## 2013-04-18 ENCOUNTER — Ambulatory Visit: Payer: Self-pay | Admitting: Cardiovascular Disease

## 2014-01-01 ENCOUNTER — Encounter (HOSPITAL_COMMUNITY): Payer: Self-pay | Admitting: Cardiovascular Disease

## 2014-05-12 NOTE — Op Note (Signed)
PATIENT NAME:  Larry Walters, WOJCICKI MR#:  [id number] DATE OF BIRTH:  02-Mar-1950  DATE OF PROCEDURE:  11/21/2011  PREOPERATIVE DIAGNOSIS: Visually significant cataract of the right eye.   POSTOPERATIVE DIAGNOSIS: Visually significant cataract of the right eye.   OPERATIVE PROCEDURE: Cataract extraction by phacoemulsification with implant of intraocular lens to right eye.   SURGEON: Birder Robson, MD.   ANESTHESIA:  1. Managed anesthesia care.  2. Topical tetracaine drops followed by 2% Xylocaine jelly applied in the preoperative holding area.   COMPLICATIONS: None.   TECHNIQUE:  Stop and chop.   DESCRIPTION OF PROCEDURE: The patient was examined and consented in the preoperative holding area where the aforementioned topical anesthesia was applied to the right eye and then brought back to the Operating Room where the right eye was prepped and draped in the usual sterile ophthalmic fashion and a lid speculum was placed. A paracentesis was created with the side port blade and the anterior chamber was filled with viscoelastic. A near clear corneal incision was performed with the steel keratome. A continuous curvilinear capsulorrhexis was performed with a cystotome followed by the capsulorrhexis forceps. Hydrodissection and hydrodelineation were carried out with BSS on a blunt cannula. The lens was removed in a stop and chop technique and the remaining cortical material was removed with the irrigation-aspiration handpiece. The capsular bag was inflated with viscoelastic and the Technis ZCBOO   23.5-diopter lens, serial number [id number], was placed in the capsular bag without complication. The remaining viscoelastic was removed from the eye with the irrigation-aspiration handpiece. The wounds were hydrated. The anterior chamber was flushed with Miostat and the eye was inflated to physiologic pressure. The wounds were found to be water tight. The eye was dressed with Vigamox. The patient was given  protective glasses to wear throughout the day and a shield with which to sleep tonight. The patient was also given drops with which to begin a drop regimen today and will follow-up with me in one day.   ____________________________ Livingston Diones. Zadin Lange, MD wlp:cbb D: 11/21/2011 15:25:56 ET T: 11/21/2011 15:35:26 ET JOB#: [id number]  cc: Mara Favero L. Lasonya Hubner, MD, <Dictator> Livingston Diones Suhaib Guzzo MD ELECTRONICALLY SIGNED 11/30/2011 10:23

## 2014-05-15 NOTE — Discharge Summary (Signed)
PATIENT NAME:  Larry Walters, Larry Walters MR#:  [id number] DATE OF BIRTH:  [birth year]  DATE OF ADMISSION:  06/07/2012 DATE OF DISCHARGE:  06/10/2012  HOSPITAL COURSE: See dictated history and physical for details of admission. This 64 year old man was admitted to the hospital after taking an excessive amount of Xanax, becoming belligerent and possibly dangerous to himself. Initially, he was showing very poor insight and was irritable and argumentative. He was monitored for any signs of withdrawal, but did not show any seizures or delirium. Over a few days, he showed a great improvement in his affect and mood. He showed better insight into his abuse of benzodiazepines, became calmer and more cooperative. He totally denied suicidal ideation. By the time of discharge, his affect was appropriate and more upbeat and lucid. He totally denied suicidal ideation or homicidal ideation and showed spontaneous insight into the importance of staying off of Xanax. The patient was discharged with followup to be with Dr. Bridgett Larsson in the community. He was treated for his high blood pressure and diabetes as well as continued on low dose Prozac. He was continued on metronidazole and ciprofloxacin presumably for his gallbladder surgery he had recently had. He had multiple medicines for his COPD. Medically, appeared stable and agreed to appropriate outpatient treatment.   DISCHARGE MEDICATIONS: Atorvastatin 20 mg at bedtime, ciprofloxacin 500 mg p.o. q. 12 hours, diltiazem ER 120 mg per day, Prozac 10 mg per day, Flonase nasal spray 2 sprays to both nostrils daily, Flagyl 500 mg q. 12 hours, Daliresp 500 mcg per day, Plavix 75 mg per day, albuterol inhaler 2 puffs q. 4 hours p.r.n. shortness of breath, Advair Diskus 250/50, 1 puff twice a day and Spiriva Handihaler 1 capsule per day.   LABORATORY RESULTS: Chemistry panel on admission normal except for elevated glucose at 130. TSH normal. Alcohol undetected. Drug screen positive for  benzodiazepines. CBC showed elevated white count at 11.2, low hemoglobin at 11.4, hematocrit at 36.5 and MCV low at 74. Urinalysis unremarkable.   MENTAL STATUS EXAM AT DISCHARGE: Neatly dressed and groomed. Looks his stated age. Good eye contact, normal psychomotor activity. Speech normal rate, tone and volume. Affect euthymic, reactive and appropriate. Mood stated as okay. Thoughts are lucid without loosening of associations. Denies auditory or visual hallucinations. Denies suicidal or homicidal ideation. Shows improved judgment and insight. Intelligence is normal.   DISPOSITION: Discharge home. Follow up with Dr. Bridgett Larsson.   DIAGNOSIS, PRINCIPAL AND PRIMARY:  AXIS I:  1.  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  2.  Benzodiazepine abuse.  AXIS II: Deferred.  AXIS III: Hypertension, recent gallbladder surgery and elevated cholesterol.  AXIS IV: Severe from recent pain and chronic home stress.  AXIS V: Functioning at time of discharge 56.   ____________________________ Gonzella Lex, MD jtc:aw D: 06/10/2012 23:38:57 ET T: 06/11/2012 06:56:00 ET JOB#: [id number]  cc: Gonzella Lex, MD, <Dictator> Gonzella Lex MD ELECTRONICALLY SIGNED 06/11/2012 9:37

## 2014-05-15 NOTE — Consult Note (Signed)
PATIENT NAME:  Larry Walters, Larry Walters MR#:  [id number] DATE OF BIRTH:  Feb 03, 1950  DATE OF CONSULTATION:  08/04/2012  CONSULTING PHYSICIAN:  Simonne Come. Gittin, MD  HISTORY OF PRESENT ILLNESS:  Mr. Dentremont is a 64 year old patient who was admitted with a history of COPD and coronary artery disease who was admitted on 07/12. He had exacerbation of chronic obstructive pulmonary disease. He felt his pulse pounding in his head. He did not have any retrosternal chest pain. He did have some wheezing. He had a hemoglobin of 7.5 and is admitted. He had a CT of the chest that showed emphysema and fibrotic changes, no pulmonary embolism. He did was hospitalized and put on Solu-Medrol and Zithromax,  and some electrolyte disorder, hypokalemia, is being replaced. Hematology is consulted with a hemoglobin that was 7.5.   PAST MEDICAL HISTORY: COPD and wears oxygen at night. He has coronary artery disease. He had a stent placed last month. He has peripheral vascular disease. He has a history of smoking but is only on a few cigarettes currently.   PAST SURGICAL HISTORY: CABG, cholecystectomy, stenting of coronary artery disease, AAA repair.   ALLERGIES: PENICILLIN, THAT CAUSED A RASH.   FAMILY HISTORY: Apparently his father died of a brain aneurysm. No family history of malignancy.   REVIEW OF SYSTEMS: Notably regarding anemia, the patient gives a history that he saw Dr. Koleen Nimrod of [facility] at the end of last year. He was told that he needed monthly iron shots for anemia. He has a long-standing history of bruising blood rectally, sometimes small amounts, sometimes just smears or droplets from hemorrhoids, worse when straining. Reviewing his old chart, his hemoglobin had been 15 and 16 in the past, had been 13 last year and was 11.4 in May of this year. The GI point of view, he had gallbladder surgery at [facility] in May, and he has no abdominal pain or bloating since then. He has no straining at stool since  that time.  Additional system review: Currently the pounding, pulse is resolved. His breathing is better. He has no current headache, not dizzy, not short of breath at rest now on oxygen. He does not have any hemoptysis. He has some cough. Regarding hemoptysis, he has a history of gross hemoptysis, bright red blood recently. He discussed this with Dr. Raul Del in the past. Dr. Raul Del had recommended a bronchoscopy, the patient declined. He did spontaneously resolve the hemoptysis. He has no abdominal pain, nausea, vomiting or diarrhea. No dysuria or hematuria. His stools have been dark from iron. He has not noticed any difference in color or volume. He has had right shoulder pain recently that has been evaluated by the admitting physician without any recent injury. He has a positive history of anxiety and depression.  He feels really comfortable now.  He does not feel particularly anxious at the current time. He has no history of thyroid disease or diabetes.   PHYSICAL EXAMINATION: GENERAL: He is alert and cooperative, slightly anxious.  HEENT: Sclerae no jaundice. Mouth no thrush.  LYMPH: No palpable lymph nodes in the neck, supraclavicular, submandibular, or axilla.  LUNGS: Clear with decreased air entry.  ABDOMEN: Nontender. No palpable mass or organomegaly.  HEART: Regular.  EXTREMITIES: No extremity edema.  NEUROLOGIC: Grossly nonfocal. Alert and oriented except for anxiety. Mood is unremarkable. No rash. He has some old bruising on the forearms.   LABORATORY AND RADIOLOGICAL DATA: Creatinine was 0.92 on admission, sodium was 130, potassium was 3.5. Liver  functions were normal. Calcium was 8.6. White count 9.6, hemoglobin 7.5, dropped to 7, platelets are 340.  LABORATORY AND RADIOLOGICAL DATA:  CT scan of the chest showed emphysema.  Iron studies: Ferritin was 4, iron saturation was 4%. EKG showed a right bundle branch block.   IMPRESSION AND PLAN: Multiple medical problems, including  coronary artery disease and chronic obstructive pulmonary disease, currently treated. Some low sodium and low potassium being addressed. Right shoulder pain, as being followed up by the admitting physician appears to be musculoskeletal, and he may need a physical therapy or orthopedic evaluation. Apparently he had declined an x-ray of the shoulder. He has regular pulmonary follow-up COPD, he has had hemoptysis that has been addressed in the past. With regard to iron deficiency anemia, low iron demonstrated today. He gives a history that he probably was told he had iron deficiency back last year. He was recommended for treatments but had declined. He has been on oral iron from primary care physician. He is on ferrous sulfate 3 times a day. He had recently just briefly cut down to once a day. He has had chronically dark stools from iron and has not noticed any change. He has not noticed any active bleeding.   With respect to iron deficiency, hgb  declined in spite of oral iron. There is no sign of active bleed. He may be losing blood steadily or be a poor absorber of oral iron. He has hemorrhoidal bleeding, but I have concern that he could have additional GI source of blood. He is currently clinically stable. He has already had 1 dose of Venofer 100 mg IV that he tolerated very well. He has mild leukocytosis, neutrophilia.  It  is probably reactive from COPD or from iron deficiency stimulated marrow, and now, of course, he been given steroids.   PLAN: The patient needs additional IV iron. I would continue with Venofer since he has tolerated that. Later, he could go to the more concentrated Feraheme if he had a continued requirement. He should get at least 3 more doses initially.  I would give him the next dose tomorrow. He can get further doses in the clinic if he is to be discharged. He should be seen in the [place] the next day. I would watch his hemoglobin and watch his white count and make sure he has a  response to intravenous iron, then would set him up for p.r.n. iron infusions. I would switch him to ferrous fumarate polysaccharide iron which might be better absorbed. He would need regular medical followup. I would review the B12 and make sure he reconstitutes, but currently there is no suspicion of other causes of anemia. With regard to transfusions, the patient is currently stable, but intravenous iron is going to stimulate his reticulocytes.  He will have response in 2 to 3 days. It would be probably a week before he could have a meaningful jump in the hemoglobin.  So, if his hemoglobin drops or he is not stable from the medical point of view, he would need packed red blood cell transfusions. I have discussed with him and recommended colonoscopy, explained the rationale about possibly polyps or premalignant or early malignant lesions causing blood loss.  I would defer to GI if endoscopy would be indicated, but he should have a GI evaluation for luminal studies. The patient currently declines. Finally, with no active bleeding and a recent stent, it would be judicious to keep the patient on aspirin and Plavix, if possible.  I would defer to Medicine and Cardiology whether to hold, we should get stools checked immediately; he would certainly have to stop if he had active bleeding, but currently there is no sign of active bleeding. I will follow the patient tomorrow to repeat hemoglobin and for additional Venofer and with the issues as outlined above.    ____________________________ Simonne Come. Inez Pilgrim, MD rgg:cb D: 08/04/2012 13:18:52 ET T: 08/04/2012 16:08:47 ET JOB#: [id number]  cc: Simonne Come. Inez Pilgrim, MD, <Dictator> Dallas Schimke MD ELECTRONICALLY SIGNED 08/07/2012 18:30
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
## 2014-05-15 NOTE — Consult Note (Signed)
Brief Consult Note: Diagnosis: IRON DEFICIENCY ANEMIA, EXACCERBATION OF COPD.   Patient was seen by consultant.   Comments: SEE DICTATED NOTE TO FOLLOW PATIENT SEEN CHART REVIEWED, HGB OK AT 8.6, NO SIGN ACTIVE BLEEDING, AGREE WITH CHECKING STOOL GUIACS. FROM PRIOR W/U AND CURRENT FINDINGS, DO NOT SUSPECT ANY OTHER SIGNIFICANT CAUSE OF ANEMIA, EXCEPT IRON DEFICIENCY PLUS ANEMIA OF CHRONIC DISEASE. CURRENT HGB STILL CONSISTENT WITH FLUID SHIFTS, HYDRATION EFFECT,  SPECIMIN VARIABILITY, PLUS PERSISTENT IRON DEFICIENCY, AND DESTRUCTION OF PRIOR TRANSFUSED PRBC...WOULD FOLLOW HGB DAILY.  WOULD REPEAT IV VENOFER, 100MG  IV DAILY X 4 DAYS.  IF HGB DROPPED OR IF HEMODYNAMICALLY UNSTABLE, THEN TRANSFUSE PRBC AS PER USUAL INDICATIONS. WOULD WATCH RETIC RESPONS AND CHECK EPO LEVEL. I HAVE CHECKED COOMBS AND IS NEGATIVE.  Electronic Signatures: Dallas Schimke (MD)  (Signed 23-Jul-14 21:01)  Authored: Brief Consult Note   Last Updated: 23-Jul-14 21:01 by Dallas Schimke (MD)

## 2014-05-15 NOTE — Discharge Summary (Signed)
PATIENT NAME:  Larry Walters, Larry Walters MR#:  [id number] DATE OF BIRTH:  02/01/1950  DATE OF ADMISSION:  08/03/2012 DATE OF DISCHARGE:  08/05/2012  PRIMARY CARE PHYSICIAN:  Rose Fillers. [place], [place]  PULMONOLOGIST: Wallene Huh, MD  CHIEF COMPLAINT: Shortness of breath.   DISCHARGE DIAGNOSES: 1.  Acute on chronic respiratory failure secondary to chronic obstructive pulmonary disease exacerbation.  2.  Chronic respiratory failure, on home oxygen.  3.  Severe iron deficiency anemia status post intravenous iron and blood transfusion.  4.  Chronic anxiety.   CODE STATUS: FULL CODE.   MEDICATIONS: 1.  Albuterol nebs every 4 hours as needed.  2.  Prednisolone ophthalmic acetate 1% eye drops to right eye 4 times a day.  3.  Seroquel 25 mg 4 times a day for anxiety.  4.  ProAir HFA 90 mcg/inhalation 2 puffs 4 times a day.  5.  Spiriva 1 capsule inhalation daily.  6.  Cardizem CD 120 mg p.o. daily.  7.  Fluticasone 2 sprays daily as needed.  8.  Atorvastatin 20 mg daily.  9.  Aspirin 81 mg daily.  10.  Plavix 75 mg daily.  11.  Fluticasone/salmeterol 250/50 one puff b.i.d.  12.  Tramadol 50 mg b.i.d.  13.  Ferrous fumarate iron polysaccharide capsule 1 p.o. daily.  14.  Prednisone taper.  15.  Azithromycin 1 tablet daily for 4 more days.  HOME OXYGEN:  2 liters nasal cannula continuous.   DISCHARGE INSTRUCTIONS:  Follow up with Dr. Inez Pilgrim, oncology/hematology, on Thursday, 07/17, at 3:00 p.m. Follow up with Larry Sleet, PA, with Dr. Maralyn Sago office, in 1 to 2 weeks. Use your oxygen and nebulizers as before.  LABORATORY AND DIAGNOSTICS: Hemoglobin at discharge is 8.5. Occult stool negative. Retic count 3.3. White count is 9.8 and platelet count is 303. Basic metabolic panel within normal limits. HDL is 62. Magnesium 2.1. Vitamin B12 556.  Folic acid is 4.7. Troponin less than 0.02. Ferritin 4.  Serum iron is 17. Chest x-ray consistent with COPD.  No pneumonia. White count is again 9.6.  Comprehensive metabolic panel within normal limits.   CONSULTANTS:  Hematology with Dr. Inez Pilgrim.   BRIEF SUMMARY OF HOSPITAL COURSE: Kevontay Burks is a 64 year old Caucasian gentleman with chronic respiratory failure due to COPD with ongoing tobacco abuse and severe chronic anxiety along with iron deficiency anemia, who comes in with:  1.  Acute on chronic hypoxic respiratory failure secondary to COPD exacerbation. The patient received IV Solu-Medrol and p.o. Zithromax and was continued on nebulizer, Advair and Spiriva. His symptoms improved. He will finish up prednisone taper and Zithromax as outpatient. Chest x-ray did not show pneumonia.  2.  Acute on chronic iron deficiency anemia. Hemoglobin dropped from 11.4 down to 6.9 in the last few months. He received 1 unit of blood transfusion. Stool occult was negative. The patient never had colonoscopy and is too anxious to get it. He received 3 doses of IV Venofer.  Dr. Inez Pilgrim saw the patient and recommended further work-up as outpatient. Appointment has been set up. The patient has the need to continue aspirin and Plavix and it was okay with Dr. Inez Pilgrim to continue that.  3.  Right shoulder pain. Appeared musculoskeletal.  P.r.n. tramadol was continued.  4.  History of CAD with recent stent placement. Aspirin and Plavix and statin were continued. Beta blockers were not given secondary to end-stage COPD. 5.  Peripheral vascular disease. On aspirin and Plavix.  6.  Tobacco abuse. Smoking cessation was advised.  Hospital stay otherwise remained stable. The patient remained a FULL CODE.  ____________________________ Hart Rochester. Posey Pronto, MD sap:sb D: 08/06/2012 07:31:17 ET T: 08/06/2012 08:04:10 ET JOB#: [id number]  cc: Hazael Olveda A. Posey Pronto, MD, <Dictator> Rose Fillers. [place], [place] Simonne Come Inez Pilgrim, MD Kirstie Peri Caryn Section, MD Herbon E. Raul Del, MD   Ilda Basset MD ELECTRONICALLY SIGNED 08/12/2012 19:07

## 2014-05-15 NOTE — H&P (Signed)
PATIENT NAME:  Larry Walters, Larry Walters MR#:  [id number] DATE OF BIRTH:  1950-07-21  DATE OF ADMISSION:  08/11/2012  REFERRING PHYSICIAN: Dr. Lurline Hare.   PRIMARY CARE PHYSICIAN: Freddy Finner, PA, at Dr. Maralyn Sago office.   CARDIOLOGIST: Dr. Fletcher Anon.   PULMONARY: Dr. Raul Del.   CHIEF COMPLAINT: Shortness of breath.   HISTORY OF PRESENT ILLNESS: This is a 64 year old male with history of COPD on oxygen at nighttime, coronary artery disease with a stent last month, as well with history of iron deficiency anemia with IV iron and packed red blood cell infusion during his last admission. The patient was recently discharged from [facility] before 6 days due to COPD exacerbation, where he was discharged on p.o. tapering dose. The patient was receiving p.o. prednisone taper. Last dose was before yesterday. The patient reports yesterday he had worsening shortness of breath which required him to use oxygen all day. As well, he used his inhaler multiple times, even though he reports that he felt too short of breath to use his inhaler. As well, he was complaining of a cough with productive yellow sputum. Upon presentation to the ED, the patient was significantly dyspneic, where he received IV Solu-Medrol and multiple nebulizer treatments with some relief of his symptoms. The patient's chest x-ray did not show any opacity or infiltrate. Hospitalist service was contacted to admit the patient for further treatment of his COPD exacerbation. The patient denies any fever, chills, sweating, chest pain, palpitation, dizziness, lightheadedness, epistaxis or runny nose.   PAST MEDICAL HISTORY:  1. COPD, on oxygen at nighttime.  2. Coronary artery disease with a stent last month in the proximal LAD 60% lesion.  3. Anemia, iron deficiency, with recent IV iron transfusion and blood transfusion. Following with Dr. Inez Pilgrim.  4. Peripheral vascular disease.  5. Hyperlipidemia.   PAST SURGICAL HISTORY:  1. Cataract.  2.  Cholecystectomy.  3. Peripheral vascular disease with stent.  4. Coronary artery disease with a stent.  5. AAA repair.   ALLERGIES: PENICILLIN WHICH CAUSES RASH.   HOME MEDICATIONS:  1. Aspirin 81 mg oral daily.  2. Tramadol 50 mg oral 2 times a day.  3. Cardizem CD 120 mg oral extended release.  4. Atorvastatin 20 mg oral daily.  5. Plavix 75 mg oral daily.  6. Quetiapine 100 mg oral at bedtime.  7. Albuterol nebulizer as needed.  8. Advair 250/50 one puff b.i.d.  9. ProAir inhaler.  10. Spiriva 1 capsule inhalational daily.  11. Ferrous fumarate/iron polysaccharide 162/115.2 mg oral 1 capsule 2 times a day.  12. Sucralfate 1 g oral 4 times a day.  13. Fluticasone each nostril daily.   SOCIAL HISTORY: The patient smokes but he cut significantly, currently only smokes 2 cigarettes per day. No alcohol. No drug use. He is on disability.   FAMILY HISTORY: Father died at the age of 90 of brain aneurysm. Mother died at the age of 76 of COPD.   REVIEW OF SYSTEMS:  CONSTITUTIONAL: The patient denies fever, chills, sweating. Complains of wheezing. Denies weight gain, weight loss.  EYES: Denies blurry vision, double vision, inflammation, glaucoma.  ENT: Denies tinnitus, ear pain, epistaxis or discharge.  RESPIRATORY: Complains of cough, wheezing, dyspnea, productive yellow sputum and COPD. Denies any hemoptysis.  CARDIOVASCULAR: Denies chest pain, edema, orthopnea, palpitation, syncope.  GASTROINTESTINAL: Denies nausea, vomiting, diarrhea, abdominal pain, hematemesis, jaundice or melena.  GENITOURINARY: Denies dysuria, hematuria or renal colic.  ENDOCRINE: Denies polyuria, polydipsia, heat or cold intolerance.  HEMATOLOGY: Denies  anemia, easy bruising, bleeding diathesis.  INTEGUMENT: Denies acne, rash or skin lesions.  MUSCULOSKELETAL: Denies gout, arthritis or cramps.  NEUROLOGIC: Denies CVA, TIA, dementia, ataxia, dysarthria, epilepsy.  PSYCHIATRIC: Denies insomnia, bipolar disorder,  schizophrenia, substance or alcohol abuse.   PHYSICAL EXAMINATION:  VITAL SIGNS: Temperature 98.1, pulse 76, respiratory rate 20, blood pressure 136/60, saturating 96% on 2 liters nasal oxygen.  GENERAL: Elderly male, looks comfortable in bed, in no apparent distress.  HEENT: Head atraumatic, normocephalic. Pupils equal, reactive to light. Pink conjunctivae. Anicteric sclerae. Moist oral mucosa.  NECK: Supple. No thyromegaly. No JVD. No carotid bruits.  CHEST: The patient had decreased air entry bilaterally with diffuse wheezing. No rales. No rhonchi.  CARDIOVASCULAR: S1, S2 heard. No rubs, murmurs, gallops.  ABDOMEN: Soft, nontender, nondistended. Bowel sounds present.  EXTREMITIES: No edema. No clubbing. No cyanosis. Dorsalis pedis pulse felt bilaterally but diminished, more on the right than the left.  LYMPHATICS: No lymphadenopathy in the neck.  MUSCULOSKELETAL: No joint effusion, tenderness or erythema. No clubbing or cyanosis in the extremities.  NEUROLOGIC: Cranial nerves grossly intact. Motor 5 out of 5. Deep tendon reflexes +2 bilaterally in the lower extremities.  PSYCHIATRIC: Awake, alert, oriented x3. Intact judgment and insight.  SKIN: Normal skin turgor. Warm and dry.   PERTINENT LABS: Glucose 107, BUN 10, creatinine 0.9, sodium 137, potassium 4, chloride 104, CO2 31.   Troponin less than 0.02.   White blood cells 13.2, hemoglobin 10, hematocrit 32.3, platelets 197.   EKG showing right bundle branch block which is old, at 92 beats per minute.   ASSESSMENT AND PLAN:  1. Chronic obstructive pulmonary disease exacerbation: The patient's symptoms started after he stopped his prednisone. Unclear if he will need to be on prednisone for long term, but he is following with Dr. Raul Del so we will have him follow with Dr. Raul Del upon discharge regarding this, but meanwhile he will be admitted. He will be kept on intravenous Solu-Medrol 80 every 8 hours. Will be on DuoNebs every 4 hours  and p.r.n. albuterol. As well, will continue his home medications, including Spiriva and Advair. Will be kept on oxygen 2 liters, and given the fact he is having cough with productive sputum yellow in color, he will be started on intravenous levofloxacin.  2. History of coronary artery disease: The patient had recent stent. Denies any chest pain. Will continue with aspirin, Plavix and statin.  3. Anemia: Iron deficiency anemia. Hemoglobin appears to be stable. Recently received intravenous iron and packed red blood cells. Has an appointment with Dr. Inez Pilgrim this Thursday. Meanwhile, will continue with iron supplement.  4. Tobacco abuse: The patient was counseled for 5 minutes. He will be started on NicoDerm patch.  5. Hyperlipidemia: Continue with statin.  6. Deep vein thrombosis prophylaxis: Subcutaneous heparin.  7. Gastrointestinal prophylaxis: The patient is on Protonix and Carafate given the fact he is on large dose intravenous steroids.   CODE STATUS: The patient reports he wishes to be DNR. Does not wish for any resuscitation efforts.   TOTAL TIME SPENT ON ADMISSION AND PATIENT CARE: 55 minutes.   ____________________________ Albertine Patricia, MD dse:gb D: 08/12/2012 00:19:36 ET T: 08/12/2012 00:58:30 ET JOB#: [id number]  cc: Albertine Patricia, MD, <Dictator> Kareen Hitsman Graciela Husbands MD ELECTRONICALLY SIGNED 08/12/2012 23:31

## 2014-05-15 NOTE — H&P (Signed)
PATIENT NAME:  Larry Walters, Larry Walters MR#:  [id number] DATE OF BIRTH:  [birth year]  DATE OF ADMISSION:  09/20/2012  HISTORY OF PRESENT ILLNESS:  The patient was seen and evaluated in the [facility] and then brought to the hospital under observation, re-examined again up on the floor, although this narrative is delayed. The patient is 64 years old with history of anemia and subacute on chronic blood loss with anemia, he has had prior transfusion x 1. He has had a number of doses of intravenous Venofer. He has been recommended for GI work-up, but has refused. He had one stool negative during a prior hospitalization. He has not had any repeat stools checked. The patient was being followed for possible transfusion and possible intravenous iron. He returned to the clinic and he reports that he has continued weakness, although this waxes and wanes, he had more fatigue in the clinic today, he gets short of breath on exertion, but also gets panic attacks. He was not short of breath at rest. He does have some intermittent cough. He does get, but did not have any active palpitations or chest pain. He did not have orthostasis. His hemoglobin was down to 8.6 from earlier in the week. He also reports that his stools were dark and he was adamant that he has not been taking oral iron. He will not take oral iron because it causes him constipation. Therefore, he was placed in the hospital under observation, anticipating that he would have positive stools, and that his hemoglobin might drop further, and intravenous iron not be adequate and then he would likely need 1 unit of blood because regular iron supplementation had had failed to increase his hemoglobin, in fact it had been slowly decreasing.   PAST MEDICAL HISTORY: A history of coronary arteries, hypertension, kidney stones claudication, abdominal aortic aneurysm and cataracts.   SOCIAL HISTORY: A smoker, but no alcohol.   FAMILY HISTORY: His father died of a brain  aneurysm.   ALLERGIES: PENICILLIN, CITALOPRAM, MIRTAZAPINE AND SERTRALINE.   MEDICATIONS: Multiple regular medications have been prednisone 10 mg a day, trazodone 50 mg at bedtime.  He had been on atorvastatin. He is on Plavix 75 mg daily, Cardizem CD 120 daily, quetiapine 100 mg daily, tramadol 50 mg b.i.d. p.r.n. for pain, Advair Diskus 1 puff twice a day, Spiriva 1 inhalation daily, Proventil 2 puffs 4 times a day p.r.n. and Daliresp 500 mcg 1 tablet daily.   ADDITIONAL SYSTEM REVIEW: He was not having headache. He was not having dizziness, he was not having ear or jaw pain. No stomatitis or thrush. No productive cough. No hemoptysis. No retrosternal chest pain. No bone pain. No edema. No rash or bruising.   PHYSICAL EXAMINATION: GENERAL: He is alert and cooperative, anxious.  HEENT: No jaundice. No thrush in the mouth.  LYMPHATICS: No palpable lymph nodes in the neck, cervix and or axilla.  LUNGS: Decreased air entry. No wheezing or rales.  HEART: Regular.  ABDOMEN: Nontender.  It was grossly nonfocal.  VITAL SIGNS: Stable with a systolic blood pressure lower than his prior baseline. His heart was regular. His oxygen saturation was good. His hemoglobin dropped then 8.6.   IMPRESSION AND PLAN: As in the current illness, hemoglobin slowly declined even with IV iron. Also, the patient report is dark to black colored stools in the absence taking any oral iron, although no gross bleed was seen and he had not had a bowel movement during the day. His last bowel movement was  a day ago, so he was clearing not having gross melena or significant active bleed. He is hospitalized for low volume IV fluid, and recheck the hemoglobin. Plan to give him IV ferriheme. We would plan to give a unit of blood irradiated blood, later because of a logistic factors, blood was not given that evening. He was to be observed overnight and decision made at giving the blood if hemoglobin was falling or if dark stools were  confirmed. Blood is planned to be used irradiated products as he may be a candidate for lung transplant in the future. He will also be seen if he is discharged as anticipated in stable condition within 1 to 2 weeks for follow-up hemoglobin and iron studies and ferriheme p.r.n.     ____________________________ Simonne Come. Inez Pilgrim, MD rgg:cc D: 09/24/2012 20:59:00 ET T: 09/24/2012 21:37:31 ET JOB#: [id number]  cc: Simonne Come. Inez Pilgrim, MD, <Dictator> Dallas Schimke MD ELECTRONICALLY SIGNED 10/23/2012 11:23

## 2014-05-15 NOTE — Consult Note (Signed)
PATIENT NAME:  Larry Walters, Larry Walters MR#:  [id number] DATE OF BIRTH:  1950/03/31  DATE OF CONSULTATION:  06/06/2012  CONSULTING PHYSICIAN:  Riona Lahti S. Gretel Acre, MD  REASON FOR CONSULTATION: Brought by the police after picking up a gun and pointing it at the sister-in-law and at himself.   HISTORY OF PRESENT ILLNESS: The patient is a 64 year old married male who was brought in on IVC papers after he picked up a gun and was pointing first at his sister-in-law and then at himself. He reportedly was trying to hurt himself. He was taking Ambien and Xanax in an attempt to rest, but then the Xanax did not relax him. The patient has a long history of depression, and he follows with Dr. Bridgett Larsson in [facility]. He reported that he feels very depressed during the daytime. During my interview as I entered his room, the patient reported that he just wants to go away, and he does not want to stay here. He reported that his wife worked during the daytime, and he feels very depressed as he is very lonely and tired. He reported that he was not trying to hurt himself, and he has guns at home, but he was not trying to do anything to harm himself. The patient reported that he was brought by the police for unknown reasons. He appeared very anxious and angry during the interview. He stated that he does not know why the police brought him in the handcuffs. He was minimizing the reason for his admission. He reported that he does not want to answer if he has tried to hurt himself in the past. The patient has history of mood swings and has been taking the Seroquel as well as gabapentin, Xanax and Ambien which was prescribed by Dr. Ulis Rias at [place]: The patient has history of suicide attempt in the past, as he was holding a gun in his hand and stated that he would shoot himself. He is currently following with Dr. Bridgett Larsson at [facility]. He has been diagnosed with depression. He is  currently prescribed Seroquel 50 mg twice daily, Xanax as well as Ambien at bedtime.   ALLERGIES: PENICILLIN.   SOCIAL HISTORY: The patient is currently living with his third wife. He reported that his first wife passed away after having lupus. He reported that his son was 50 years old at that time. He reported that he moved to [place] after that and met a girl.  He remained married with her for only 5 months, and then she died in a car accident in [place]. He reported that he met his third wife in [place]. They are currently married for the past 14 years. He stated that he has a good relationship with her. The patient stated that he does not have any children from this wife. He stated that he is currently retired and lives at home.  SUBSTANCE ABUSE HISTORY: The patient denied using any drugs or alcohol. He stated that he stays at home and his wife currently works. He denied any pending legal charges.   MENTAL STATUS EXAM: The patient is a thinly-built male who has several bruises on his hands which he stated were obtained while he was being handcuffed by the police. He was upset about the same. His speech was somewhat agitated. Mood was anxious and upset. Affect was congruent. Thought process was circumstantial. Thought content was nondelusional. He was unable to contract for safety as he  was trying to hurt himself. He demonstrated poor insight and judgment.   VITAL SIGNS: Temperature 98.1, pulse 92, respirations 18, blood pressure 142/60.   LABORATORY DATA: Glucose 139, BUN 10, creatinine 0.72, sodium 139, potassium 3.5, chloride 106, bicarbonate 28, GFR 60, anion gap 5, osmolality 279, calcium 8.7. Blood alcohol less than 3.0. Protein 7.3, albumin 3.6, bilirubin 0.2, alkaline phosphatase 94, AST 25, ALT 32. TSH 0.57. WBC 11.2, RBC 4.94, hemoglobin 11.4, hematocrit 36.5, platelet count 458, MCV 74, MCH 23.1, MCHC 31.2, RDW 17.2.   REVIEW OF SYSTEMS:  CONSTITUTIONAL: No fever or chills. No  weight changes.  EYES: No double or blurred vision.  RESPIRATORY: No shortness of breath or cough.  CARDIOVASCULAR: No chest pain or orthopnea. GASTROINTESTINAL: No abdominal pain, nausea, vomiting or diarrhea.  GENITOURINARY: No urgency or frequency.  ENDOCRINE: No heat or cold intolerance.  LYMPHATIC: No anemia or easy bruising.  INTEGUMENTARY: No acne or rash.  MUSCULOSKELETAL: No muscle or joint pain.   DIAGNOSTIC IMPRESSION: AXIS I:  1.  Bipolar disorder, most recent episode mixed, moderate.  2.  Anxiety disorder, not otherwise specified.   AXIS II: None.     AXIS III:  1.  History of colon issues.  2.  Hypertension.  3.  Kidney stones.  4.  Chronic obstructive pulmonary disease.  5.  Status post tonsillectomy.  6.  Status post cataract removal.   TREATMENT PLAN: 1.  The patient is currently under involuntary commitment.  2.  He will be transferred to the behavioral health unit once he is medically stable as the staff just called me and reported that he had a fall. He will be continued on his medications as prescribed.   Thank you for allowing me to participate in the care of this patient.   ____________________________ Cordelia Pen. Gretel Acre, MD usf:cb D: 06/06/2012 16:47:08 ET T: 06/06/2012 16:56:24 ET JOB#: [id number]  cc: Cordelia Pen. Gretel Acre, MD, <Dictator> Jeronimo Norma MD ELECTRONICALLY SIGNED 06/11/2012 16:39

## 2014-05-15 NOTE — Discharge Summary (Signed)
Dates of Admission and Diagnosis:  Date of Admission 09-Jul-2012   Date of Discharge 10-Jul-2012   Admitting Diagnosis Unstable angina   Final Diagnosis Unstable angina   Discharge Diagnosis 1 CAD   2 COPD   3 PAD   4 Hyperlipidemia   5 AAA   6 Tobacco abuse   7 Sinus tachycardia    Chief Complaint/History of Present Illness Mr. Larry Walters is a 64yo male with the above medical problems who was observed overnight at [facility] on 6/17 to 07/10/12 for unstable angina s/p cardiac catheterization and PCI.  He has an extensive medical history including CAD (s/p cath 08/2011- RCA CTO w/ L->R collaterals, 50% prox LAD), COPD, tobacco abuse, PVD (s/p stent R common iliac artery), AAA (s/p endovascular repair 01/2011), sinus tachycardia (improved on diltiazem) and hyperlipidemia.  He followed up with Dr. Fletcher Anon on 07/01/12. He had recently underwent a cholecystectomy and noted increased dyspnea on exertion, classified as NYHA stage III symptoms. He had previously underwent nuclear stress testing which was limited by the patient's severe COPD and myocardial scar. Thus, the decision was made to proceed with diagnostic cardiac catheterization. The details, risks, and benefits of the procedure were explained to the patient who wished to proceed.     Cardiac Catherization:  17-Jun-14 08:25   Cardiac Catheterization  [facility] [address] [place], [place] [address] [phone]   Cardiovascular Catheterization Comprehensive Report   Patient: Larry Walters Study date: 07/09/2012 MR number: [id number] Account number: [id number]   DOB: 1950/11/11 Age: 64 years Gender: Male Race: White Height: 65 in Weight: 134.6 lb   Interventional Cardiologist:  Kathlyn Sacramento, MD   SUMMARY:   -HEMODYNAMICS: Hemodynamic assessment demonstrates borderline systemic hypertension and normal LVEDP.   -CORONARY CIRCULATION: There was significant 2-vessel coronary artery disease. Known  chronically occluded RCA with excellent left to right collaterals. There is a 60% stenosis in proximal LAD with an FFR ratio of 0.77 (significant).   -CARDIAC STRUCTURES: EF was not assessed and EF by echo was 55 %.   -1ST LESION INTERVENTIONS: A drug-eluting stent was performed on the 60 % lesion in the proximal LAD. Following intervention there was a 0 % residual stenosis.   -RECOMMENDATIONS: Patient management should include aggressive medical therapy, a cardiac rehabilitation program, and smoking cessation. Recommend dual antiplatelet therapy for at least 1 year.   HEMODYNAMICS: Hemodynamic assessment demonstrates borderline systemic hypertension and normal LVEDP.   CORONARY CIRCULATION: The coronary circulation is right dominant. There was significant 2-vessel coronary artery disease. Known chronically occluded RCA with excellent left to right collaterals. There is a 60% stenosis in proximal LAD with an FFR ratio of 0.77 (significant). Left main: Normal. LAD: The vessel was normal sized. Proximal LAD: There was a discrete 60 % stenosis. There was TIMI grade 3 flow through the vessel (brisk flow). An intervention was performed. 1st diagonal: The vessel was very small sized. 2nd diagonal: The vessel was normal sized. Angiography showed no evidence of disease. 3rd diagonal: The vessel was small sized. Circumflex: The vessel was normal sized. Angiography showed minor luminal irregularities. 1st obtuse marginal: The vessel was small sized. Angiography showed minor luminal irregularities. 2nd obtuse marginal: The vessel was normal sized. Angiography showed no evidence of disease. 3rd obtuse marginal: The vessel was normal sized. Angiography showed minor luminal irregularities. Ramus intermedius: The vessel was small sized. Angiography showed minor luminal irregularities. RCA: The vessel was normal sized. There was a 100 % stenosis in the proximal third of the  vessel segment.    VENTRICLES: EF was not assessed and EF by echo was 55 %. RECOMMENDATIONS: -Patient management should include aggressive medical therapy, a cardiac rehabilitation program, and smoking cessation. Recommend dual antiplatelet therapy for at least 1 year.   INDICATIONS: Angina/MI: stable angina, CCS class III.   HISTORY: No history of previous myocardial infarction. There was no prior diagnosis of congestive heart failure. The patient has a history of current cigarette use. There was no history of cerebrovascular disease, diabetes, dyslipidemia, or hypertension. There was no family history of coronary artery disease. PRIOR CARDIOVASCULAR PROCEDURES: No history of valve surgery, coronary or graft percutaneous intervention, or coronary bypass surgery.   PRIOR DIAGNOSTIC TEST RESULTS: No prior stress test is available.   PROCEDURES PERFORMED: Left heart catheterization with ventriculography. Procedure: IV Doppler: Intial Vessel Intervention on proximal LAD: drug-eluting stent.   PROCEDURE: The risks and alternatives of the procedures and conscious sedation were explained to the patient and informed consent was obtained. The patient was brought to the cath lab and placed on the table. The planned puncture sites were prepped and draped in the usual sterile fashion. Cardiac catheterization performed electively.   -ACT measurement.   -Right radial artery access. The vessel was accessed, a wire was threaded into the vessel, and a wasadvanced over the wire into the vessel.   -Left heart catheterization. A catheter was advanced to the ascending aorta. Ventriculography was performed using power injection of contrast agent.   -IV Doppler: Intial Vessel.   LESION INTERVENTION: A drug-eluting stent was performed on the 60 % lesion in the proximal LAD. Following intervention there was a 0 % residual stenosis. This was an ACC/AHA "non-high risk" lesion for intervention. There was TIMI 3 flow  before the procedure and TIMI3 flow after the procedure. There was no acute vessel closure. There was no perforation. There was no dissection.   -Vessel setup was performed. A 25F XB 3 guiding catheter was used to intubate the vessel.   -Vessel setup was performed. A Volcano Primewire .014 Straight wire was used to cross the lesion.   -Coronary flow reserve was measured.   -A Xience EX 3.0 x 15MM drug-eluting stent was placed across the lesion and deployed at a maximum inflation pressure of 14 atm.   PROCEDURE COMPLETION: An IABP was not used. No mechanical ventricular support was required. TIMING: Test started at 08:36. Test concluded at 09:29. RADIATION EXPOSURE: Fluoroscopy time: 7.85 min. MEDICATIONS GIVEN: Midazolam, 1 mg, IV, at 08:34. Fentanyl, 48mg, IV, at 08:34. Midazolam, 1 mg, IV, at 08:35. Fentanyl, 50 mcg, IV, at 09:21. Verapamil (Isoptin, Calan, Covera), 2.5 mcg, intracoronary, at 08:43. Adenosine (Adenocard), infusion rate of 140 mcg/kg/min, IV, at 09:05. Heparin, 3000 units, IV,last dose at 08:44. Heparin, 2000 units, IV, last dose at 09:08. Aspirin, 324 mg, PO, last dose at 09:18. Clopidogrel (Plavix), 300 mg, PO, last dose at 09:23. CONTRAST GIVEN: Isovue 120 ml.   Prepared and signed by   MKathlyn Sacramento MD Signed 07/09/2012 09:46:00   STUDY DIAGRAM   Angiographic findings Native coronary lesions:  Proximal LAD: Lesion 1: discrete, 60 % stenosis.  RCA: Lesion 1: 100 % stenosis. Intervention results Native coronary lesions: drug-eluting stent of the 60 % stenosis in proximal LAD. 0 % residual stenosis. Stent: Xience EX 3.0 x 15MM drug-eluting.   HEMODYNAMIC TABLES   Pressures:  Baseline Pressures:  - HR: 105 Pressures:  - Rhythm: Pressures:  -- Aortic Pressure (S/D/M): 141/66/95 Pressures:  -- Arterial (S/D/M): 102/49/69 Pressures:  --  Left Ventricle (s/edp): 139/9/--   Outputs:  Baseline Outputs:  -- CALCULATIONS: Age in years[age] 60.87 Outputs:   -- CALCULATIONS: Body Surface Area: 1.67 Outputs:  -- CALCULATIONS: Height in cm: 165.00 Outputs:  -- CALCULATIONS: Sex: Male Outputs:  -- CALCULATIONS: Weight in kg: 61.20  Cardiology:  17-Jun-14 10:02   Ventricular Rate 74  Atrial Rate 74  P-R Interval 178  QRS Duration 118  QT 396  QTc 439  P Axis 60  R Axis -86  T Axis 70  ECG interpretation Sinus rhythm with Premature atrial complexes Left axis deviation Incomplete right bundle branch block ST elevation consider inferior injury or acute infarct *** ** ** ** * ACUTE MI  ** ** ** ** Abnormal ECG When compared with ECG of 02-MAY-201408:30, Premature atrial complexes are now Present Questionable change in QRS axis ----------unconfirmed---------- Confirmed by OVERREAD, NOT ([id number]), editor PEARSON, BARBARA ([id number]) on 07/09/2012 10:11:43 AM  Routine Chem:  18-Jun-14 05:58   Glucose, Serum  102  BUN 9  Creatinine (comp) 0.97  Sodium, Serum 140  Potassium, Serum 3.7  Chloride, Serum 105  CO2, Serum 27  Calcium (Total), Serum 8.7  Anion Gap 8  Osmolality (calc) 278  eGFR (African American) >60  eGFR (Non-African American) >60 (eGFR values <43m/min/1.73 m2 may be an indication of chronic kidney disease (CKD). Calculated eGFR is useful in patients with stable renal function. The eGFR calculation will not be reliable in acutely ill patients when serum creatinine is changing rapidly. It is not useful in  patients on dialysis. The eGFR calculation may not be applicable to patients at the low and high extremes of body sizes, pregnant women, and vegetarians.)  Cardiac:  17-Jun-14 21:20   CPK-MB, Serum 1.3 (Result(s) reported on 09 Jul 2012 at 10:18PM.)   Hospital Course:  [facility]. He was informed, consented and prepped via the R radial artery. This revealed significant 2-vessel CAD- known RCA CTO with excellent left to right collaterals and a 60% stenosis in proximal LAD  with an FFR ratio of 0.77 (significant). A DES was successfully placed to this lesion. The patient's hemodynamics were consistent with borderline systemic hypertension. He tolerated the procedure well without complications. The recommendation was made to continue with DAPT- ASA/Plavix indefinitely.  He was observed overnight. There were no events. The patient denied dyspnea or chest pain. He was evaluated by Dr. AFletcher Anonthis morning who deemed him stable for discharge. He will be discharged on the medication regimen outlined below to include ASA, Plavix, statin, NTG SL PRN. BB was not started given severe COPD to avoid bronchospasm. Diltiazem was resumed for sinus tachycardia. He will follow-up in the office in ~ 2 weeks. This appointment has been made for him. This information, including post-cath instructions and activity restrictions, has been clearly outlined in the discharge AVS.   Condition on Discharge Good   DISCHARGE INSTRUCTIONS HOME MEDS:  Medication Reconciliation: Patient's Home Medications at Discharge:     Medication Instructions  albuterol 2.5 mg/3 ml (0.083%) inhalation solution  3 milliliter(s) (1 vial) inhaled every 4 hours, As Needed - for Shortness of Breath   prednisolone ophthalmic acetate 1% ophthalmic suspension  1 drop(s) to right eye 4 times a day   metronidazole 500 mg oral tablet  1 tab(s) orally every 12 hours infection   quetiapine 25 mg oral tablet  1 tab(s) orally 4 times a day anxiety   proair hfa cfc free 90 mcg/inh inhalation  aerosol  2 puff(s) inhaled 4 times a day, As Needed - for Shortness of Breath   tiotropium 18 mcg inhalation capsule  1 cap(s) inhaled once a day copd   cardizem cd 120 mg/24 hours oral capsule, extended release  1 cap(s) orally once a day   fluticasone nasal  2 spray(s)  once a day congestion   ciprofloxacin 500 mg oral tablet  1 tab(s) orally every 12 hours infection   prednisone 10 mg oral tablet  1 tab(s) orally once a day    acetaminophen-hydrocodone 325 mg-5 mg oral tablet  1 tab(s) orally every 4 hours, As needed, pain   nitroglycerin 0.4 mg sublingual tablet  1 tab(s) sublingual , As needed, angina, hypertension   fluoxetine 10 mg oral capsule  1 cap(s) orally once a day   atorvastatin 20 mg oral tablet  1 tab(s) orally once a day   aspirin 81 mg oral delayed release tablet  1 tab(s) orally once a day   clopidogrel 75 mg oral tablet  1 tab(s) orally once a day   fluticasone-salmeterol 250 mcg-50 mcg inhalation powder  1 puff(s) inhaled 2 times a day     Physician's Instructions:  Diet Low Sodium  Low Fat, Low Cholesterol   Activity Limitations As tolerated   Return to Work 2 weeks   Time frame for Follow Up Appointment 1-2 weeks   Other Comments Follow-up with Dr. Fletcher Anon 07/25/12 at 11:30 AM.   Electronic Signatures: Meriel Pica (PA-C)   (Signed 18-Jun-14 10:05)  Authored: ADMISSION DATE AND DIAGNOSIS, CHIEF COMPLAINT/HPI, [place], [place] [facility], PATIENT INSTRUCTIONS Kathlyn Sacramento (MD)   (Signed 18-Jun-14 10:26)  Co-Signer: CHIEF COMPLAINT/HPI  Last Updated: 18-Jun-14 10:26 by Kathlyn Sacramento (MD)

## 2014-05-15 NOTE — Consult Note (Signed)
PATIENT NAME:  Larry Walters, SCHNITZLER MR#:  [id number] DATE OF BIRTH:  04-24-1950  DATE OF CONSULTATION:  08/14/2012  CONSULTING PHYSICIAN:  Simonne Come. Inez Pilgrim, MD  HISTORY OF PRESENT ILLNESS:  Mr. Gaster is a 64 year old man known to me from recent hospital consultation, and he has been to the [facility] recently for intravenous Venofer. He was seen and evaluated and a note placed on the chart on July 23rd.  This narrative is delayed until this morning. The patient with a history of COPD and also on recent hospitalizations for anemia and COPD, was admitted on July 20th with worsening shortness of breath. He was treated in the Emergency Room with nebulizers and Solu-Medrol. X-ray was not revealing, except for possible nipple shadow versus tiny nodule, but as recently as July 12th he had a CT scan of the chest that revealed no masses. He has been treated for exacerbation of COPD. He is admitted with a hemoglobin just over 10. In the 3 days of hospitalization, his hemoglobin has drifted down to 8.5. He had no sign of active bleeding. Stool guaiac was pending.  Hematology was reconsulted for the slight drop in his hemoglobin.   PAST MEDICAL AND SURGICAL HISTORY:  The patient has a past history of COPD, coronary artery disease, recently had a stent placed. Iron deficiency anemia recently noted. He is pending a colonoscopy. He has peripheral vascular disease and hyperlipidemia, cataracts and prior cholecystectomy, AAA repair.   ALLERGIES: PENICILLIN, WHICH CAUSES A RASH.  HOME MEDICATIONS:  With aspirin 81 daily, tramadol 50 twice a day, Cardizem CD 120 daily, atorvastatin 20 daily, Plavix 75 daily, quetiapine 100 mg orally at night, albuterol nebulizers p.r.n., Advair 1 puff twice a day, ProAir inhaler, Spiriva inhaler, oral iron 1 tablet twice a day; he has recently stopped taking, encouraged to resume. Sucralfate 1, 4 times a day. Fluconazole nasal spray.   SOCIAL HISTORY:  He reportedly is only smoking 2  cigarettes a day recently. He has recently smoked more. No alcohol.   FAMILY HISTORY: His father died of a brain aneurysm. Mother died at the age of 70, COPD.  REVIEW OF SYSTEMS:  When I saw the patient, he was more comfortable. He still had some shortness of breath. His chest felt tight, but he is no longer wheezing. He has some nonproductive cough. He feels like he cannot get the material out. He had no visual disturbance. No ear or jaw pain. He appears to have had yellow sputum but now the cough is nonproductive. No hemoptysis. No chest pain. No palpitations. No abdominal pain, nausea, vomiting, diarrhea. No dysuria or hematuria. No polyuria or polydipsia. He has no easy bleeding or bruising or rashes. He has no arthritic conditions and no focal weakness. No history of seizure or stroke.   PHYSICAL EXAMINATION: GENERAL: He was alert and cooperative. No obvious pallor. No jaundice.  HEENT:  Mouth: No thrush.  NECK: No mass in the neck.  LYMPHATICS: No palpable lymph nodes in neck supraclavicular submandibular axilla.  LUNGS: Decreased air entry throughout. There was no wheezing on my exam.  ABDOMEN: Nontender. No palpable mass or organomegaly.  EXTREMITIES: No edema.  NEUROLOGIC: Grossly nonfocal. Moving all extremities. Equal strength. Did not test his gait. He was in the bed.  MUSCULOSKELETAL: No swollen joints.   LABORATORY DATA:  The creatinine is 0.9 on admission. His troponin was low.  His hemoglobin was 10, and then down 8.5. His platelets were normal. White blood cells are slightly elevated. The  EKG showed an old bundle branch block.   IMPRESSION AND PLAN: The patient with medical problems being treated as above. He also has persistent anemia. He repeated his iron studies, which showed that his ferritin was slightly better than before. This could just be an acute phase reactant, but he is still iron deficient. He is status post 4 doses total of Venofer. No history of active bleeding, but  he needs his stools checked. I rechecked the [place], and it is still negative. The rest of his CBC is unremarkable, except for some leukocytosis attributed to inflammation and steroid use. He still appears to have uncomplicated iron deficiency, although he clearly has an element of anemia of chronic disease that is probably blunting his retic response. He should have his retics followed, hemoglobin followed, and erythropoietin level checked. I would give him 4 more doses of Venofer daily, 100 mg IV daily. Watch his stools, watch for bleeding. Later, follow him in the [facility]. Later, if he does not reconstitute and his iron is still low, then might go to the more concentrated ferriheme, but he has tolerated Venofer without adverse reaction, so I would use the same   product. No indication acutely for transfusion. Would defer to medicine.  If the patient cardiovascular instability, then he could be transfused p.r.n. according to the usual criteria, but he was stable to my exam.    ____________________________ Simonne Come. Inez Pilgrim, MD rgg:dmm D: 08/15/2012 09:39:26 ET T: 08/15/2012 10:11:34 ET JOB#: [id number]  cc: Simonne Come. Inez Pilgrim, MD, <Dictator> Dallas Schimke MD ELECTRONICALLY SIGNED 08/27/2012 9:36

## 2014-05-15 NOTE — H&P (Signed)
PATIENT NAME:  Larry Walters, Larry Walters MR#:  [id number] DATE OF BIRTH:  05-15-1950  DATE OF ADMISSION:  08/03/2012  PRIMARY CARE PHYSICIAN:  Freddy Finner, PA, at Dr. Maralyn Sago office.  CARDIOLOGIST:  Dr. Fletcher Anon   PULMONOLOGIST:  Dr. Raul Del.   CHIEF COMPLAINT:  Shortness of breath.   HISTORY OF PRESENT ILLNESS:  This is a 64 year old man who has a history of COPD and coronary artery disease. He does wear oxygen at night. He also has a history of anemia. He is coming in with trouble breathing, getting worse over time, real severe starting yesterday. His shortness of breath is worse when he moves around. He feels his heart beating in his head. He wears his oxygen at night, but today he had wear during the day, also. He felt short of breath when I took off the oxygen just here in the Emergency Room even though he did not desaturate. He does complain of wheeze, and he is coughing up whitish phlegm. In the ER, he was found to have a hemoglobin of 7.5. A CT scan of the chest showed emphysematous lung disease with fibrotic changes. No pulmonary embolism. He did state that he did cough up some blood upon discharge from the hospital the last time and that cleared up. Hospitalist services were contacted for further evaluation.   PAST MEDICAL HISTORY:  COPD, wears oxygen at night, coronary artery disease with a stent placed the last month in the proximal LAD 60% lesion. Also has anemia and peripheral vascular disease.   PAST SURGICAL HISTORY:  Cataracts, cholecystectomy, peripheral vascular disease with stents, coronary artery disease with stents, triple-A repair.   ALLERGIES:  PENICILLIN, WHICH CAUSES A RASH.   MEDICATIONS:  Include albuterol nebulizer 3 mL every 4 hours as needed for shortness of breath, aspirin 81 mg daily, Lipitor 20 mg daily, Cardizem CD 120 mg extended-release daily, Plavix 75 mg daily, ferrous fumarate 162 mg daily, Flonase 2 sprays each nostril daily, Advair Diskus 250/50 one inhalation twice  a day, prednisolone 1% ophthalmic suspension one drop right eye 4 times a day, ProAir CFC 1 puff 4 times a day as needed for shortness of breath, Seroquel 25 mg 4 times a day, Spiriva 1 inhalation daily, tramadol 50 mg twice a day.   SOCIAL HISTORY:  He smokes two cigarettes per day. No alcohol. No drug use. He is on disability.   FAMILY HISTORY:  Father died at age 27 of a brain aneurysm. Mother died at 60 of COPD  REVIEW OF SYSTEMS: CONSTITUTIONAL:  No fever. Positive for chills. No sweats. Positive for weakness.  EYES:  He did have cataract surgery.  EARS, NOSE, MOUTH, AND THROAT:  Positive for sore throat, dysphagia with a sore throat.  CARDIOVASCULAR:  No chest pain.  RESPIRATORY:  Positive for shortness of breath. Positive for wheeze. Positive for cough, whitish phlegm. Positive for hemoptysis a few weeks ago, bright red, that has stopped.  GASTROINTESTINAL:  No nausea. No vomiting. No abdominal pain. Positive for black stools with iron.  GENITOURINARY:  No burning on urination. No hematuria.  MUSCULOSKELETAL:  Positive for right shoulder pain going on for a few days, hardly able to lift up his right arm.  NEUROLOGIC:  No fainting or blackouts.  PSYCHIATRIC:  Positive anxiety or depression.  ENDOCRINE:  No thyroid problems.  HEMATOLOGIC AND LYMPHATIC:  A history of anemia.   PHYSICAL EXAMINATION: VITAL SIGNS:  Temperature 98.2, pulse 79, respirations 20, blood pressure 151/69, pulse ox 100% on 3 L.  GENERAL:  No respiratory distress.  EYES:  Conjunctivae and lids normal. Pupils equal, round, and reactive to light. Extraocular muscles intact. No nystagmus. Conjunctivae pale. Lids normal.  EARS, NOSE, MOUTH, AND THROAT:  Tympanic membranes:  No erythema. Nasal mucosa:  No erythema.  THROAT:  No erythema. No exudate seen.  LIPS AND GUMS:  No lesions.  NECK:  No JVD. No bruits. No lymphadenopathy. No thyromegaly. No thyroid nodules palpated.  RESPIRATORY:  Decreased breath sounds  bilaterally. Positive wheeze throughout entire lung field. No rales or rhonchi heard.  CARDIOVASCULAR:  S1, S2 normal. No gallops, rubs or murmurs heard. Carotid upstroke 2+ bilaterally. No bruits. Dorsalis pedis pulses difficult to palpate right foot, 1+ left foot. bilaterally.  LYMPHATIC:  No lymph nodes in the neck.  MUSCULOSKELETAL:  No edema. No cyanosis on oxygen.  SKIN:  No rashes or ulcers seen.  NEUROLOGIC:  Cranial nerves II through XII grossly intact. Deep tendon reflexes 2+ bilateral lower extremities.  PSYCHIATRIC:  The patient is oriented to person, place and time.   LABORATORY AND RADIOLOGICAL DATA:  Glucose 123, BUN 9, creatinine 0.92, sodium 130, potassium 3.5, chloride 106, CO2 25, calcium 8.6. Liver function tests normal range. White blood cell count 9.6, H and H 7.5, hematocrit 23.8, platelet count of 340. Troponin negative.   CHEST X-RAY:  No acute cardiopulmonary disease. CT scan of the chest with contrast showed emphysematous lung disease with fibrotic changes. No thoracic aneurysm or dissection, no evidence of pulmonary embolism.   I ordered iron studies which showed a ferritin of 4, LDH 176, iron saturation of 4%, iron serum 17.   EKG showed a normal sinus rhythm, right bundle branch block.   ASSESSMENT AND PLAN:  1.  A chronic obstructive pulmonary disease exacerbation:  ER physician gave 125 mg of IV Solu-Medrol. I will continue 60 mg IV q.6 hours, give IV Zithromax. Continue nebulizers and Spiriva and Advair at this time.  2.  Anemia. Hemoglobin did drop down from 11.4 to 7.5. He does have severe iron deficiency anemia. The patient wanted to hold off today on transfusion. I will recheck a hemoglobin tomorrow. I will give IV Venofer since the patient is iron deficient and guaiac stools. The ER physician did a guaiac, which was negative. Need to continue the aspirin and Plavix with the patient's heart disease and peripheral vascular disease, but we will need to monitor  closely.  3.  Right shoulder pain. This is likely musculoskeletal. The patient having difficulty moving his right arm up, but able to flex it. He does have pain over the top of his shoulder coming down to the deltoid insertion. The IV Solu-Medrol will help. He refused an x-ray at this time. We will get OT evaluation.  4.  A history of coronary artery disease with recent stent. Continue aspirin, Plavix. Unclear why the patient is not on a beta blocker. It may be secondary to severe chronic obstructive pulmonary disease. Continue Lipitor and check a lipid profile in the a.m.  5.  Peripheral vascular disease on aspirin and Plavix.  6.  Tobacco abuse. Smoking cessation counseling done 3 minutes by me. Since the patient only puffs on two cigarettes per day, this can be cold [place]. No need for a nicotine patch.  7.  Hypokalemia: We will replace potassium and recheck in the morning. Also check a magnesium in the morning.  TIME SPENT ON ADMISSION:  55 minutes.   ____________________________ Tana Conch. Leslye Peer, MD rjw:jm D: 08/03/2012 15:22:11 ET  T: 08/03/2012 15:53:52 ET JOB#: [id number]  cc: Tana Conch. Leslye Peer, MD, <Dictator> Kirstie Peri. Caryn Section, MD Mertie Clause. Fletcher Anon, MD Phylliss Blakes Raul Del, MD Marisue Brooklyn MD ELECTRONICALLY SIGNED 08/06/2012 19:09

## 2014-05-15 NOTE — Discharge Summary (Signed)
PATIENT NAME:  Larry Walters, Larry Walters MR#:  [id number] DATE OF BIRTH:  July 15, 1950  DATE OF ADMISSION:  08/11/2012 DATE OF DISCHARGE:  08/15/2012  ADMITTING DIAGNOSIS: Shortness of breath.   DISCHARGE DIAGNOSES: 1.  Shortness of breath due to acute on chronic obstructive pulmonary disease exacerbation.  2.  Acute on chronic iron deficiency anemia with guaiac stool being negative.  3.  History of coronary artery disease, status post recent stent.  4.  Peripheral vascular disease.  5.  Chronic anxiety.  6.  Hyperlipidemia.  7.  Status post cataract surgery.  8.  Status post cholecystectomy.  9.  Status post stent placement for peripheral vascular disease.  10.  Abdominal aortic aneurysm status post abdominal aortic aneurysm repair. 11.  Leukocytosis, likely related to steroid therapy.   CONSULTANT: Dr. Inez Pilgrim.   PERTINENT LABS AND EVALUATIONS: Admitting glucose was 166, BUN 15, creatinine 0.91, sodium was 139, potassium 3.7, chloride 105, CO2 was 29, calcium 8.8. LFTs were normal. WBC count 16.4, hemoglobin 10.2, platelet count was 316. INR 0.9. Blood cultures x 2 no growth. EKG showed sinus rhythm with PACs, incomplete right bundle branch block. His chest x-ray on admission showed findings consistent with COPD.  Repeat chest x-ray on the 24th showed no acute cardiopulmonary processes, findings consistent with COPD.   HOSPITAL COURSE: Please refer to H and P done by the admitting physician. The patient is a 64 year old white male with history of COPD, coronary artery disease with stent placement last month who presented back with worsening shortness of breath. The patient was noted to have acute COPD exacerbation, acute on chronic respiratory failure. The patient was treated with nebulizers, steroids and oxygen therapy. With these treatments, his breathing improved significantly, and due to his recurrence of shortness of breath this time he is being discharged on a prolonged p.o. prednisone. He was  seen by Dr. Raul Del who recommended possible home with home hospice due to his severe COPD.  The patient did not want to do this yet. He will go see Dr. Raul Del and decide on this. He also was noted to have acute on chronic iron deficiency anemia. His guaiac was negative. He has been seen by Dr. Inez Pilgrim in the past. He was continued on iron supplements. He did not require any transfusions. At this time, his breathing is significantly improved, and he is stable for discharge.   DISCHARGE MEDICATIONS: Albuterol, Atrovent nebs q. 4 p.r.n., ProAir 2 puffs 4 times a day as needed, Spiriva 18 mcg daily, Cardizem CD 120 daily, fluticasone 2 sprays for nasal congestion daily, atorvastatin 20 daily, aspirin 81, 1 tab p.o. daily, Plavix 75 p.o. daily, Advair 250/50, 1 puff b.i.d., tramadol 50, 1 tab p.o. b.i.d., iron fumarate 1 tab p.o. b.i.d., sucralfate 1 gram 4 times a day prior to meals and at bedtime, quetiapine 100, 1 tab p.o. at bedtime, Tylenol 650 q. 4 p.r.n., Protonix 40 daily, prednisone prolonged taper, start at 60, taper by 5 mg until complete, Levaquin 500, 1 tab p.o. q. 24 x 4 days, morphine 2.5 mL q. 4 p.r.n. for shortness of breath.  HOME OXYGEN: Yes, 2 liters at all times.   DIET: Low sodium.   ACTIVITY: As tolerated.   FOLLOWUP: With primary MD in 1 to 2 weeks, Dr. Raul Del in 2 to 4 weeks, with Dr. Inez Pilgrim in 1 to 2 weeks.   TIME SPENT: 45 minutes on the discharge.  ____________________________ Lafonda Mosses. Posey Pronto, MD shp:cb D: 08/15/2012 15:22:42 ET T: 08/15/2012 20:06:48 ET JOB#:  [id number]  cc: Arvella Massingale H. Posey Pronto, MD, <Dictator> Alric Seton MD ELECTRONICALLY SIGNED 08/26/2012 8:55

## 2014-05-15 NOTE — Consult Note (Signed)
Brief Consult Note: Diagnosis: IDA  EXACCERBATION OF COPD   CAD.   Patient was seen by consultant.   Consult note dictated.   Comments: 1. IDA HGB 7.0 WAS 7.5 YESTERDAY, FERRITIN AND IRON SAT CONFIRMED LOW  S/P ONE DOSE VENOFER 100MG  GIVEN 7/12, NO SIGN ACTIVE BLEEDING, STOOLS DARK FROM IRON , STOOL GUIAC PENDING.  2. HGB IN THE PAST 15 G, WAS 15.5 IN 2013, AS PER PATIENT WAS SEEN LATE 2013 BY DR Koleen Nimrod IN [place], ADVISED MONTHLY SHOTS IRON FOR ANEMIA, PATIENT REPORTS REGULAR BLEEDING FROM HEMORRHOIDS, SMALL AMOUNTS, WORSE WITH STRAINING  3. RE PRIOR HEMOPTYSIS, HAS DISCUSSED WITH DR Vella Kohler, ADVISED BRONCHOSCOPY, PATIENT DECLINED, HAS NOT HAD ANY RECURRENT HEMOPTYSIS  4. MILD LEUKOCYTOSIS, LIKELY REACTIVE TO IRON DEFICIENCY, COPD, AND NOW STEROIDS GIVEN, WILL NEED F/U CBC IN FUTURE       SUGG. 1 NEEDS ADDITIONAL IV IRON, AS ALREADY TOLERATED VENOFER, WOULD CONTINUE WITH SAME, WILL GIVE 4 DOSES TOTAL INITIALLY, NEXT DOSE IN AM, WOULD TAKE ABOUT 72HRS FOR INITIAL RETIC RESPONSE AND A WEEK FOR MEANINGFULL RESPONSE, IF CLINICALLY UNSTABLE OR BLEEDING OR HGB DROPS WOULD NEED TRANSFUSION.  2 WOULD LATER CHANGE FROM FERROUS SULPHATE TO FERROUS FUMARATE /POLYSACHHARIDE, MAY BE BETTER ABSORBED. 3. CAN F/U HGB AND IRON TX IN CANCER CENTER THE DAY AFTER DISCHARGE  4. HAVE SUGGESTED GI CONSULT AND COLONOSCOPY, IF OK WITH CARDIOLOGY AND AFTER HGB IMPROVED, EXPLAINED RATIONALLE TO PATIENT, CURRENTLY HE DECLINES.Marland KitchenNOTE AGE 64 HAS NEVER HAD COLONOSCOPY SO INDICATED ALSO ON AGE BASED FOR SCREENING  5.WITH NO EVIDENCE OF ACTIVE BLEED, WOULD WANT TO KEEP ON ASA AND PLAVIX.Marland KitchenDEFER TO MEDICINE AND CARDIOLOGY RE DECISION TO H0LD OR CONTINUE.  Electronic Signatures: Dallas Schimke (MD)  (Signed 13-Jul-14 13:07)  Authored: Brief Consult Note   Last Updated: 13-Jul-14 13:07 by Dallas Schimke (MD)

## 2014-05-15 NOTE — Consult Note (Signed)
Reason for Visit: This 64 year old Male patient presents to the clinic for initial evaluation of  head and neck cancer .   Referred by Dr. Radene Journey.  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 Einar Grad intravenous iron replacement with multiple comorbidities including oxygen-dependent COPD anxiety attacks. He is on prednisone for his lung disease and has been turned down for a lung transplant. He is recently presented with increasing throat pain. He was examined by Dr. Radene Journey and found to have a mass on the laryngeal surface of the epiglottis appear to be ulcer granulation tissue. Vocal cords were normal and mobile. No evidence of adenopathy in the neck on examination was seen. Biopsy of the epiglottis mass was positive for it for invasive squamous cell carcinoma P. 16 immunostaining was negative. He is seen today for consideration of radiation therapy. He is in significant pain at this time and is requesting pain medication. He does have some mild to moderate dysphasia.  Past Hx:    xanax abuse:    CAD:    colon issue, no bm in 4 days, something showed on ct:    claudication:    HTN:    kidney stones:    Uses O2 at hs and prn:    COPD:    severe anxiety:    GB removed:    triple A:    tonsillectomy:    cataract removal:   Past, Family and Social History:  Past Medical History positive   Cardiovascular coronary artery disease; hypertension; peripheral vascular disease; claudication, abdominal aortic aneurysm   Respiratory COPD   Genitourinary kidney stones   Neurological/Psychiatric anxiety   Past Surgical  History cholecystectomy; cataract removal, tonsillectomy   Family History positive   Family History Comments father expired from brain aneurysm no family history of malignancy   Social History positive   Social History Comments Xanax abuse, greater than 50-pack-year smoking history   Allergies:   Penicillin: Rash  Citalopram: Other  Mirtazapine: Other  sertraline: Other  Home Meds:  Home Medications: Medication Instructions Status  Valium 5 mg oral tablet 1 tab(s) orally. Take medication with you to your PET Scan, they will tell you when to take medication.  Active  Diflucan 100 mg oral tablet 1 tab(s) orally once a day Active  acetaminophen-HYDROcodone 325 mg-5 mg oral tablet 1 or 2  tab(s) orally every 4 hours, As Needed - for Pain  Active  predniSONE 20 mg oral tablet 1 tab(s) orally 2 times a day Active  clopidogrel 75 mg oral tablet 1 tab(s) orally once a day Active  QUEtiapine 100 mg oral tablet 1 tab(s) orally once a day (at bedtime) Active  fluticasone-salmeterol 250 mcg-50 mcg inhalation powder 1 puff(s) inhaled 2 times a day Active  Proventil HFA 90 mcg/inh inhalation aerosol 2 puff(s) inhaled 4 times a day Active  tiotropium 18 mcg inhalation capsule 1 cap(s) inhaled once a day Active  potassium chloride 10 mEq oral tablet, extended release 1 tab(s) orally 2 times a day Active  traZODone 50 mg oral tablet 1 tab(s) orally once (at bedtime), As Needed for insomnia Active  atorvastatin 20 mg oral  tablet 1 tab(s) orally once a day Active  Cardizem CD 120 mg/24 hours oral capsule, extended release 1 cap(s) orally once a day Active  Daliresp 500 mcg oral tablet 1 tab(s) orally once a day Active  traMADol 50 mg oral tablet 1  orally 2 times a day Active  acetylcysteine 600 mg oral capsule 1 cap(s) orally 2 times a day Active  nitroglycerin 0.4 mg sublingual tablet 1 tab(s) sublingual every 5 minutes, As Needed for chest pain Active  ferrous fumarate 325 mg oral tablet 1 tab(s)  orally 2 times a day Active   Review of Systems:  General negative   Performance Status (ECOG) 0   Skin negative   Breast negative   Ophthalmologic negative   ENMT see HPI   Respiratory and Thorax see HPI   Cardiovascular see HPI   Gastrointestinal negative   Genitourinary negative   Musculoskeletal negative   Neurological negative   Psychiatric see HPI   Hematology/Lymphatics negative   Endocrine negative   Allergic/Immunologic negative   Review of Systems   review of systems obtained from nurses notes  Nursing Notes:  Nursing Vital Signs and Chemo Nursing Nursing Notes: *CC Vital Signs Flowsheet:   05-Nov-14 13:31  Temp Temperature 98.5  Pulse Pulse 91  Respirations Respirations 20  SBP SBP 135  DBP DBP 76  Pain Scale (0-10)  7  Current Weight (kg) (kg) 68.7   Physical Exam:  General/Skin/HEENT:  Skin normal   Eyes normal   Additional PE a well-developed male appears older than stated age. Patient is edentulous oral cavity is clear no oral mucosal lesions are identified. Indirect mirror examination shows upper airway clear back in a very good look at his epiglottis vallecula and base of tongue within normal limits. Cords approximating well. Neck is clear without evidence of some digastric cervical or supraclavicular adenopathy. Lungs are clear to A&P cardiac examination shows regular rate and rhythm.patient does have marked oral candidiasis   Breasts/Resp/CV/GI/GU:  Respiratory and Thorax normal   Cardiovascular normal   Gastrointestinal normal   Genitourinary normal   MS/Neuro/Psych/Lymph:  Musculoskeletal normal   Neurological normal   Lymphatics normal   Other Results:  Radiology Results: LabUnknown:    12-Jul-14 12:01, CT Chest for Pulm Embolism With Contrast  PACS Image   CT:  CT Chest for Pulm Embolism With Contrast   REASON FOR EXAM:    long time smoker with hypoxia, dyspnea and hemoptysis  COMMENTS:       PROCEDURE: CT   - CT CHEST (FOR PE) W  - Aug 03 2012 12:01PM     RESULT: CT of the chest is performed with 100 mL of Isovue-370 iodinated   intravenous contrastwith images reconstructed at 3.0 mm slice thickness   in the axial plane compared previous exam of 02/28/2012.    The patient has a current chest x-ray showing evidence of COPD. As   demonstrated on the previous CT examination there is certainly   centrilobular emphysematous disease. There is some respiratory motion   artifact. The thoracic aorta appears normal in caliber without evidence   of dissection. The adrenal glands appear to be unremarkable. The included   upper abdominal viscera appear tobe unremarkable with the exception of     nonobstructing upper pole right renal calculus measuring 2 to 3 mm. There   is no pleural or pericardial effusion. The pulmonary arterial system   demonstrates excellent opacification without evidence of pulmonary   embolism. The lung window images  demonstrate subpleural areas of   interstitial thickening and fibrosis as well as atelectasis. No definite   infiltrate or significant edema is evident. There is no pneumothorax.    IMPRESSION:   1. Emphysematous lung disease with fibrotic changes.  2. No thoracic aortic aneurysm or dissection.  3. No evidence of pulmonary embolism.    Dictation Site: 6      Verified By: Sundra Aland, M.D., MD   Relevent Results:   Relevant Scans and Labs CT scan reviewed, PET/CT scan of the head and neck area has been ordered   Assessment and Plan: Impression:   squamous carcinoma the laryngeal surface of the epiglottis in 64 year old male with multiple comorbidities Plan:   at this time I've ordered a PET/CT scan to complete his head and neck staging. I will also started him on Diflucan for 7 days for his oral candidiasis. I've also written a prescription for Vicodin for pain one tablet to be used every 4-6 when necessary pain. I have gone over risks and benefits of  radiation therapy. Would choose IMRT to spare his salivary glands and normal structures such as spinal cord oral cavity. I have set him up for CT simulation shortly after his PET/CT is reviewed. Patient will also see Dr. Cynda Acres next week although with his multiple medical Coleman comorbidities do not think concurrent chemoradiation would bewell tolerated. Will review the case personally with Dr. Cynda Acres after he sees him in consultation early next week.  I would like to take this opportunity to thank you for allowing me to continue to participate in this patient's care.  CC Referral:  cc: Dr. Shelle Iron, Dr. Jaynie Crumble   Electronic Signatures: Baruch Gouty, Roda Shutters (MD)  (Signed [phone] 14:22)  Authored: HPI, Diagnosis, Past Hx, PFSH, Allergies, Home Meds, ROS, Nursing Notes, Physical Exam, Other Results, Relevent Results, Encounter Assessment and Plan, CC Referring Physician   Last Updated: 05-Nov-14 14:22 by Armstead Peaks (MD)

## 2014-05-16 NOTE — Consult Note (Signed)
   Comments   Spoke again with patient now that his wife is here. Patient adament that there is no hope for improvement, that he is suffering, and asks that we help him die. I explained that we could not do this, although he is in charge of the scope of his care and is certainly entitled to comfort. Pt does not want to go home and I suggested instead that [facility]. May pursue this tomorrow if there are available beds. Pt would like to initiate comfort care although I will continue other supportive measures (ie. abx, steroids, etc) as these may ultimately provide him comfort if we can stabilize his breathing. I will also liberalize his morphine and lorazepam and ask that the RN give a dose now.  caremorphine and lorazepamsteroids and abxtransfer tomorrow to the Hospice Home 15 minutes  Electronic Signatures: Borders, Kirt Boys (NP)  (Signed 13-Jan-15 17:04)  Authored: Palliative Care   Last Updated: 13-Jan-15 17:04 by Irean Hong (NP)

## 2014-05-16 NOTE — H&P (Signed)
PATIENT NAME:  Larry Walters, Larry Walters MR#:  [id number] DATE OF BIRTH:  11/09/1950  DATE OF ADMISSION:  02/04/2013  ADMITTING PHYSICIAN: Gladstone Lighter, MD   PRIMARY PULMONOLOGIST:  Dr. Raul Del.    PRIMARY CARE PHYSICIAN:  At [facility].   PRIMARY ONCOLOGIST:  Dr. Inez Pilgrim.  RADIATION ONCOLOGIST:  Dr. Baruch Gouty.   CHIEF COMPLAINT:  Unable to breathe.  HISTORY OF PRESENT ILLNESS:  Mr. 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 He was noted to be hypoxic requiring 3 liters of oxygen here. The patient denies any fevers or chills recently. His cough is at baseline. He also complains of chest tightness. He has been progressively getting worse over the last few weeks, but since last night he could not breathe and presents to the ER. He is tight on exam. He does not have any fevers, and the patient is requesting to be given medications so that he can end his life soon.   PAST MEDICAL HISTORY: 1.  Coronary artery disease, status post stents.  2.  Peripheral vascular disease status post right leg stenting.  3.  Iron deficiency anemia.  4.  Stage IV COPD on 2 liters home oxygen.  5.  Squamous cell carcinoma also epiglottis and radiation therapy.  6.  AAA status post repair.  7.  Depression and anxiety.   PAST SURGICAL HISTORY: 1.  Cardiac stents.  2.  Leg stents for the right leg.  3.  Cholecystectomy.  4.  Cataract surgery.   ALLERGIES TO MEDICATIONS: 1.   CELEXA. 2.  MIRTAZAPINE.  3.  PENICILLIN. 4.  SERTRALINE.  CURRENT HOME MEDICATIONS:  1.  Acetaminophen/hydrocodone 25/10 mg 1 tablet q. 4 hours p.r.n. for pain.  2.  Atorvastatin 20 mg p.o. daily.  3.  Cardizem CD 120 mg p.o. daily.  4.  Plavix 75 mg p.o. daily.  5.  Daliresp 556mcg  p.o. daily.  6.  Duragesic patch 50 mcg q. 72 hours.  7.  Ferrous sulfate 325 mg p.o. b.i.d.  8.  Advair 250/50 one puff b.i.d.  9.  Morphine sulfate 20 mg per 5 mL solution, 2.5 mL q. 4 hours p.r.n. 10.  Prochlorperazine 10 mg p.o. 3 times a day. 11.  Proventil inhaler 2 puffs q. 6 hours p.r.n. for wheezing.  12.  Quetiapine 100 mg p.o. at bedtime.  13.  Spiriva 1 capsule daily.  14.  Tramadol 50 mg p.o. b.i.d.   15.  Trazodone 50 mg p.o. at bedtime.   SOCIAL HISTORY: Lives at home, has been followed by home hospice. Used to smoke 1 pack per day now cut down to 2 cigarettes per day. Denies any alcohol use.  FAMILY HISTORY:  Significant for heart disease in mom with COPD and dad with brain aneurysm.  REVIEW OF SYSTEMS:  CONSTITUTIONAL: No fever. Positive for fatigue and weakness.  EYES: No blurred vision, double vision, inflammation or glaucoma. Uses reading glasses.  ENT: No tinnitus, ear pain, hearing loss, epistaxis or discharge.  RESPIRATORY: Positive for cough,  wheeze and COPD. No hemoptysis.  CARDIOVASCULAR: Positive for chest pressure and tightness. Positive for orthopnea. No edema or arrhythmia. No palpitations or syncope. GASTROINTESTINAL: Positive for nausea. No vomiting, diarrhea, abdominal pain, hematemesis or melena.  GENITOURINARY: No dysuria, hematuria, renal calculus, frequency or incontinence.  ENDOCRINE: No polyuria, nocturia, thyroid problems, heat or cold intolerance.  HEMATOLOGY: No anemia, easy bruising or bleeding.  SKIN: No acne, rash or lesions.  MUSCULOSKELETAL: No neck, back, shoulder pain, arthritis or gout.  NEUROLOGICAL: No numbness, weakness, CVA, TIA or seizures.   PSYCHOLOGICAL: Positive for anxiety and depression. No insomnia.   PHYSICAL EXAMINATION: VITAL SIGNS: Temperature 98.5 degrees Fahrenheit, pulse 114, respirations 20, blood pressure 136/74, pulse oximetry 94% on 2 liters.  GENERAL: Well-built, well-nourished male, sitting in bed very anxious, restless. HEENT: Normocephalic, atraumatic. Pupils equal, round and reacting to light. Anicteric sclerae. Extraocular movements intact. Oropharynx clear without erythema or exudates.  LUNGS: Very scant breath sounds bilaterally. Coarse expiratory wheezes present. Minimal use of accessory muscles on exertion noted. No crackles or rhonchi heard.  CARDIOVASCULAR: S1, S2 regular rate and rhythm. No murmurs, rubs or gallops.  ABDOMEN: Obese, soft, nontender, nondistended. No hepatosplenomegaly. Normal bowel sounds.  EXTREMITIES: No pedal edema. No clubbing or cyanosis, 2+ dorsalis pedis pulses palpable bilaterally.  SKIN:  No acne, rash or lesion. LYMPHATICS:  No cervical lymphadenopathy. NEUROLOGIC: Cranial nerves intact. No focal motor or sensory deficits.  PSYCHOLOGICAL: The patient is awake, alert, oriented x 3.   LABORATORY DATA: WBC 14.4, hemoglobin 11.8, hematocrit 36.6, platelet count 16.   Sodium 137, potassium 3.8, chloride 102, bicarbonate 27, BUN 6, creatinine 0.8, glucose 108 and calcium of 8.7. Magnesium is 1.9. Urinalysis negative for any infection. VBG showing pH of 7.43 and pCO2 of 46. Chest x-ray showing COPD without any superimposed pneumonia or edema.   ASSESSMENT AND PLAN: This is a 64 year old male with end-stage chronic obstructive pulmonary disease on home oxygen 2 liters, epiglottic squamous cell carcinoma on radiation, peripheral vascular disease, coronary artery disease who is followed by hospice at home presents with acute dyspnea and above.  1.  Acute on chronic respiratory failure secondary to underlying chronic obstructive pulmonary disease exacerbation less likely due to  bronchitis.  Will continue the oxygen support, currently on steroids, Nebs, inhalers and also Levaquin empirically.  palliative care and pulmonary consults. The patient requesting to be comfortable rather than treatment. At this time maximizing his comfort medications would be recommended to help him to be symptom free. This discussion has been done by palliative care, as well as me with the patient and also his wife there agreeable at this time.  But we will continue his other medications to see if his respiratory status can be optimized.  If no improvement, likely transfer to hospice home.  2.  Epiglottic cancer diagnosed in December 2014. Started on radiation, received half of treatment and did not want to get the rest as he feels he needs to die. Will monitor at this time.  3.  Depression and anxiety. Continue home medications. 4.  Coronary artery disease appears to be stable. 5.  Hyperkalemia will be replaced.  CODE STATUS:  Do not resuscitate.  TIME SPENT ON ADMISSION:  50 minutes    ____________________________ Gladstone Lighter, MD rk:ce D: 02/04/2013 16:51:17 ET T: 02/04/2013 17:35:39 ET JOB#: [id number]  cc: Gladstone Lighter, MD, <Dictator> Gladstone Lighter MD ELECTRONICALLY SIGNED 02/06/2013 14:20

## 2014-05-16 NOTE — Discharge Summary (Signed)
PATIENT NAME:  Larry Walters, Larry Walters MR#:  [id number] DATE OF BIRTH:  01/10/1951  DATE OF ADMISSION:  02/04/2013 DATE OF DISCHARGE:  02/05/2013  ADMITTING PHYSICIAN:  Dr. Gladstone Lighter.   DISCHARGING PHYSICIAN:  Dr. Gladstone Lighter.  PRIMARY CARE PHYSICIAN:  Dr. Rosanna Randy.   CONSULTATIONS IN THE HOSPITAL:  1.  Pulmonary consultation with Dr. Raul Del.  2.  Palliative care consultation by Dr. Izora Gala Phifer.   DISCHARGE DIAGNOSES:  1.  Acute respiratory failure.  2.  Chronic respiratory failure, on home oxygen.  3.  Acute on chronic obstructive pulmonary disease exacerbation with acute bronchitis.  4.  Squamous cell carcinoma of the epiglottis, and on radiation therapy for the same.  5.  Iron deficiency anemia.  6.  History of coronary artery disease, status post stents.  7.  Peripheral vascular disease, status post right leg stenting.  8.  Abdomen aortic aneurysm, status post repair.  9.  Depression and anxiety.   DISCHARGE HOME MEDICATIONS:  1.  Cardizem 120 mg p.o. daily.  2.  Atorvastatin 20 mg p.o. daily.  3.  Trazodone 50 mg at bedtime p.r.n. for insomnia.  4.  Daliresp 551mcg p.o. daily.  5.  Plavix 75 mg p.o. daily.  6.  Quetiapine 100 mg p.o. at bedtime.  7.  Advair 250/50 one puff b.i.d.  8.  Proventil 2 puffs inhaled 4 times a day as needed.  9.  Spiriva inhalation capsule daily.  10.  Duragesic 50 mcg patch transdermal q.72 hours.  11.  Norco 10/325 mg tablet q.4 hours p.r.n. for pain.  12.  Tramadol 50 mg p.o. b.i.d.  13.  Morphine 20 mg per 5 mL solution 2.5 mL orally every 4 hours.  14.  Prochlorperazine 10 mg p.o. 3 times a day.  15.  Mylanta 30 mL q.4 hours p.r.n. for indigestion.  16.  DuoNebs 3 mL q.4 hours p.r.n. for wheezing.  17.  Levaquin 500 mg p.o. daily for 5 more days.  18.  Ativan 2 mg every 6 hours as needed for anxiety.   DISCHARGE HOME OXYGEN:  3 L.   DISCHARGE DIET:  Regular diet.   DISCHARGE ACTIVITY:  As tolerated.     FOLLOWUP  INSTRUCTIONS:  The patient is being discharged to hospice home.   LABORATORY AND IMAGING STUDIES PRIOR TO DISCHARGE:  1.  Urinalysis negative for any infection.  2.  Chest x-ray showing hyperinflation without any acute findings. 3.  WBC 15.4, hemoglobin 11.8, hematocrit 36.6, platelet count is 316. Troponins are negative.  4.  Sodium 137, potassium 2.8, chloride 102, bicarb 27, BUN 6, creatinine 0.82, glucose 108, and calcium of 8.7. Magnesium is 1.9.   BRIEF HOSPITAL COURSE:  Larry Walters is a 64 year old elderly Caucasian male with past medical history significant for end-stage COPD on 2 L home oxygen and squamous cell carcinoma of the epiglottis, currently on radiation therapy, finished up to half radiation treatments, coronary artery disease, peripheral vascular disease, who presents from home secondary to worsening shortness of breath.  1.  Acute on chronic respiratory failure secondary to acute on chronic COPD exacerbation with bronchitis. The patient has end-stage COPD on 2 L home oxygen already being followed by hospice at home, however, could not get comfortable and presents to the hospital. He has been placed on IV steroids, inhalers, antibiotics. However, he wants to be more comfortable and just wanted to get some rest. Palliative care was consulted, and the patient wanted to be comfort care, and hospice screen was ordered, and  at this time, he was appropriate to be transferred to a hospice home for symptom management.  2.  Epiglottic squamous cell carcinoma, started on radiation, finished up to half treatments, but patient refusing further treatment, as he just wants to be comfortable and pass away and not get treated for anything at this time.  3.  Coronary artery disease, status post stents. Appears to be stable. Continue home medications.  4.  Depression and anxiety. Started on anxiety medicine, Ativan, while in the hospital, and will continue at hospice home.  5.  His course has been  otherwise uneventful.   DISCHARGE CONDITION:  Critical with extremely poor prognosis.   DISCHARGE DISPOSITION:  Hospice home.   TIME SPENT ON DISCHARGE:  45 minutes.   ____________________________ Gladstone Lighter, MD rk:ms D: 02/05/2013 15:47:35 ET T: 02/05/2013 19:05:14 ET JOB#: [id number]  cc: Gladstone Lighter, MD, <Dictator> Armstead Peaks, MD Richard L. Rosanna Randy, MD Gladstone Lighter MD ELECTRONICALLY SIGNED 02/06/2013 14:21

## 2014-05-17 NOTE — Consult Note (Signed)
PATIENT NAME:  YAZIEL, BRANDON MR#:  [id number] DATE OF BIRTH:  August 22, 1950  DATE OF CONSULTATION:  06/20/2011  REFERRING PHYSICIAN:  Alounthith Phichith, MD  CONSULTING PHYSICIAN:  Drue Stager. [place], MD  REASON FOR CONSULTATION: Depression and anxiety.   HISTORY OF PRESENT ILLNESS: Mr. Kohles is a 64 year old married male admitted to the [facility] on 06/20/2011 due to exacerbation of COPD. He describes a number of disappointments. He has lost interest over approximately four weeks. He also has experienced the following symptoms over the past four weeks: Depressed mood, depressed concentration, decreased energy, difficulty concentrating as well as insomnia. He continues with excessive worry, feeling on edge, and muscle tension. He has required Xanax 1 mg twice a day as an outpatient to control the acute anxiety symptoms.   He describes a vicious circle of exacerbation of his COPD and decreased ability to carry out interests that he used to have. He asked his wife to join the interview. She and the patient both comment on how he would like to get out of the house more but his energy won't let him.   He did voice suicidal thoughts to his outpatient physician. He stated that if he had to continue in this condition of depression that he would rather be dead. However, the patient has no current thoughts of suicide and he never developed a plan. He is determined to do everything he can to improve his mental disposition as well as his physical disposition.   He also explained that he and his wife have been having great financial difficulty. They have medical bills mounting up and she only has so much disability income.   The greatest symptoms of concern for the patient at this time are insomnia, decreased energy, decreased interest, and poor appetite.   PAST PSYCHIATRIC HISTORY: Mr. Puig has no history of prior major depression. He has no history of increased energy,  decreased need for sleep, elevated mood or racing thoughts. He has no history of suicide attempts or psychiatric hospitalization. He has been taking Xanax as above for three months. He was started on another psychotropic medication which he took at night. He cannot recall the name and he states that it was too sedative.   FAMILY PSYCHIATRIC HISTORY: None known.   SOCIAL HISTORY: Mr. Natter and his wife live together. They have a mutually supportive marriage. Mr. Ballow used to have intermittent heavy drinking periods on Bacardi rum, however, that stopped 15 years ago. He has not consumed alcohol since. He does not use any illegal drugs.   OCCUPATION: Medically disabled. He is from [place], [place]. He does have a history of tobacco smoking which he has continued up to hospitalization. He is very much stressed by having to stop his smoking as well.   PAST MEDICAL HISTORY: 1. Abdominal aortic aneurysm. 2. Chronic obstructive pulmonary disease.  3. Peripheral vascular disease.  4. Hypertension.  5. History of pulmonary nodules which are being followed.  PAST SURGICAL HISTORY:  1. Resection of fatty tumor from his back. 2. Aneurysm repair. 3. Left cataract surgery.   ALLERGIES: Penicillin.   MEDICATIONS: The MAR is reviewed. He is on: 1. Xanax 1 mg q.12 hours. 2. Ativan 1 mg q.6 hours p.r.n.   LABORATORY DATA: EKG QTc on May 27th was 436 ms. Hemoglobin, BUN, and creatinine unremarkable.   REVIEW OF SYSTEMS: Constitutional, head, eyes, ears, nose, throat, mouth, neurologic, psychiatric, cardiovascular, respiratory, gastrointestinal, genitourinary, skin, musculoskeletal, hematologic, lymphatic, endocrine, metabolic all unremarkable.  PHYSICAL EXAMINATION:   VITAL SIGNS: Temperature 98, pulse 84, respiratory rate 18, blood pressure 115/67.   GENERAL APPEARANCE: Mr. Vavra is a middle-aged male appearing slightly cachectic. His grooming and hygiene are normal. He is sitting up  in his hospital bed with no abnormal involuntary movements.   MENTAL STATUS EXAMINATION: Mr. Seever is alert. His eye contact is good. Concentration is mildly decreased. He is oriented to all spheres. His memory is intact to immediate, recent, and remote. His fund of knowledge, intelligence, and use of language are normal. Speech is soft but involves normal rate and prosody without dysarthria. Thought process is logical, coherent, and goal directed. No looseness of associations. No tangents. Thought content no thoughts of harming himself or others. No delusions or hallucinations. Affect is constricted with appropriate tears at times. Mood is depressed. Insight is intact. Judgment is intact.   ASSESSMENT:  AXIS I:  1. Major depressive disorder, single episode, severe. 2. Generalized anxiety disorder.   AXIS II: None.   AXIS III: Chronic obstructive pulmonary disease exacerbation. Please see the past medical history.   AXIS IV: Economic, general medical.   AXIS V: 55.   Mr. Kiner is not at risk to harm himself or others. He agrees to call emergency services immediately for any thoughts of harming himself, thoughts of harming others, or distress.   The undersigned provided ego supportive psychotherapy and medication education.   The indications, alternatives, and adverse effects of Remeron were discussed with the patient. The indications, alternatives, and adverse effects of Xanax were discussed with the patient.   The patient understands and wants to start Remeron for anti-depression as well as providing some acute antianxiety benefit.    RECOMMENDATIONS:  1. Would start Remeron at 7.5 mg at bedtime. If tolerated, would then increase Remeron by 7.5 mg per day to the initial target dose of 30 mg at bedtime. Would be cautious regarding the side effect of sedation in combination with sedation side effects of benzodiazepines in the context of COPD.  2. Would ask the psychiatric case  manager of the Emergency Department to assist in setting Mr. Lariviere up with psychiatric outpatient follow-up and psychotherapy.  3. It is anticipated that he will need cognitive behavioral therapy and may also need an SSRI over the long run to treat his anxiety and help him come off of his Xanax.   ____________________________ Drue Stager. Jayd Cadieux, MD jsw:drc D: 06/20/2011 21:30:00 ET T: 06/21/2011 06:28:56 ET JOB#: [id number]  cc: Drue Stager. Shedrick Sarli, MD, <Dictator> Billie Ruddy MD ELECTRONICALLY SIGNED 06/21/2011 10:05

## 2014-05-17 NOTE — Discharge Summary (Signed)
PATIENT NAME:  Larry Walters, Larry Walters MR#:  [id number] DATE OF BIRTH:  1950/02/14  DATE OF ADMISSION:  06/20/2011 DATE OF DISCHARGE:  06/22/2011  CONSULTANT: Dr. Felizardo Hoffmann   CHIEF COMPLAINT: Cough, shortness of breath and wheezing.   DISCHARGE DIAGNOSES:  1. Acute on chronic respiratory failure from chronic obstructive pulmonary disease exacerbation, tobacco abuse. 2. Anxiety, depression.   SECONDARY DIAGNOSES:  1. Tobacco abuse. 2. Hypertension. 3. History of pulmonary nodule being followed by primary care physician.  4. Peripheral vascular disease. 5. Abdominal aortic aneurysm status post repair in 01/2011.   DISCHARGE MEDICATIONS:  1. Advair 250/50 mcg inhaled twice daily. 2. Spiriva 18 mcg inhaled daily.  3. Mirtazapine 7.5 mg daily.  4. Prednisone 40 mg daily for two days then taper x 10 mg every two days until done, eight days total. 5. Xanax 1 mg p.o. b.i.d.  6. Proventil HFA as needed. 7. Albuterol nebulizer as needed every four hours. 8. Aspirin 81 mg daily.  9. Nicotine patch 7 mg daily transdermal. 10. Lisinopril 20 mg daily.   DIET: Low sodium diet.   ACTIVITY: As tolerated.   FOLLOW UP: Please follow up with your lung doctor as previously scheduled and follow with your primary care physician in 1 to 2 weeks. Stop smoking. If symptoms worsen please call your primary care physician right away. Appointment has been scheduled with your primary care physician on 06/29/2011 at 10:00 a.m.   DISPOSITION: Home.   HISTORY OF PRESENT ILLNESS: For full details of history and physical, please see dictation done on 06/20/2011 by Dr. Inez Catalina, but briefly this is a 64 year old male with the above medical history who presented with shortness of breath, wheezing. He had no improvement with nebulizers at home and did also have a cough. Of note, he had been treated with antibiotic course as an outpatient recently and was given a dose of prednisone and was told if symptoms worsened  fill this up but he did not. On arrival he was wheezing and admitted for chronic obstructive pulmonary disease exacerbation.   LABORATORY, DIAGNOSTIC AND RADIOLOGICAL DATA: Troponin negative on arrival, creatinine 0.84, glucose 111. Initial WBC 14.7; on 05/29 WBC 18.1. Hemoglobin and hematocrit were stable. Blood cultures have been no growth to date. Sputum cultures heavy growth of unidentified organism which speciated to streptococcus agalactiae. Chest x-ray showed chronic obstructive pulmonary disease. The lung fields are clear.   HOSPITAL COURSE: Patient was admitted to the hospitalist service with IV steroids, nebulizers, Spiriva and Advair. Patient likely had acute on chronic respiratory failure. Patient is on nocturnal oxygen. He is still smoking and this was strongly discouraged to him on multiple occasions. Patient had been recently treated with Levaquin and antibiotics were withheld. His symptoms improved dramatically with the above treatment. He was also started on Robitussin AC for cough. Overall he has had significant improvement and is ambulating in the hallways without oxygen or significant wheezing. He has an appointment with a lung doctor next month which he will follow up. He will be discharged with prednisone taper and his outpatient nebulizers and inhalers.   Anxiety and depression. He had stated some suicidal ideations without intent and was seen by psychiatry. He has felt better since his respiratory symptoms have improved. He has no suicidal ideation. He was started on Remeron. He needs outpatient follow up with his primary care physician and psychiatry evaluation was also recommended to him. While hospitalized his aspirin and lisinopril were held. He did have some constipation which resolved with  a bowel regimen. He will be discharged with outpatient follow up with his PCP and lung doctor.   TOTAL TIME SPENT: 35 minutes.   CODE STATUS: Patient is FULL CODE.    ____________________________ Vivien Presto, MD sa:cms D: 06/22/2011 16:15:38 ET T: 06/23/2011 10:54:46 ET JOB#: [id number]  cc: Vivien Presto, MD, <Dictator> Rose Fillers. [place], [place]  Karel Jarvis [facility] MD ELECTRONICALLY SIGNED 07/14/2011 19:56

## 2014-05-17 NOTE — Op Note (Signed)
PATIENT NAME:  Larry Walters, Larry Walters MR#:  [id number] DATE OF BIRTH:  16-Feb-1950  DATE OF PROCEDURE:  02/10/2011  PREOPERATIVE DIAGNOSES:  1. Abdominal aortic aneurysm.  2. 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   POSTOPERATIVE DIAGNOSES:  1. Abdominal aortic aneurysm.  2. 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   PROCEDURES PERFORMED:  1. Right femoral artery cutdown.  2. Introduction catheter into aorta, bilateral groins.  3. Endovascular repair of abdominal aortic aneurysm with the Endologix Unibody device.  4. Extension cuff placed from right groin approach into abdominal aorta.   CO-SURGEONS: Hortencia Pilar, M.D. and Leotis Pain, M.D.   ACCESS: Open femoral cutdown on the right, percutaneous 8 French sheath on the left.   CONTRAST USED: Isovue approximately 80 mL.   FLUORO TIME: Approximately five minutes.   INDICATIONS: Larry Walters is a 64 year old gentleman with abdominal aortic aneurysm as well as bilateral lower extremity occlusive disease. He is therefore undergoing repair of his aneurysm to prevent rupture as well as simultaneously treating his lifestyle-disabling claudication. Risks and benefits were reviewed with the patient. Alternative therapies were offered. The patient has agreed to proceed with endovascular repair.   DESCRIPTION OF PROCEDURE: The patient is taken to Special Procedures and placed in the supine position. After adequate general anesthesia is induced and appropriate invasive monitors are placed, he is positioned supine. He is prepped from his nipples to his knees.   A linear incision is then created in the right groin with Dr. Lucky Cowboy working on the right side and myself working on the left side. Dissection is carried down, exposing the femoral sheath which is opened and the Walters femoral artery is identified. It is looped proximally and distally with Silastic vessel loops.  In the meantime, using ultrasound guidance the left Walters femoral artery is identified. It is noted to be echolucent, homogeneous, and pulsatile, indicating patency. Image is recorded for the permanent record. Micropuncture needle is then used to access the Walters femoral artery under direct visualization. Microwire followed by micro sheath, J-wire followed by 8 French sheath is inserted. Wire is then negotiated into the abdominal aorta. Working from both sides, the catheter is then introduced into the aorta. Marker pigtail is used to image the aorta in the AP projection. Predilatation of the right Walters iliac artery is then performed with simultaneous right and left iliac angioplasties using a 7 x 6 balloon on the right and a 7 x 4 balloon on the left. Inflations were to approximately 10 atmospheres.   The 17 French AFX sheath was then advanced over an Amplatz superstiff wire and loaded with the 22 x 80 x 13 x 40 AFX bifurcated device. The contralateral limb was then snared after introducing a sheath via the left groin through the 8 French sheath. The main body is then deployed, seating it squarely on the bifurcation. The wire is then removed and a 0.014 [place] wire is advanced up through the contralateral limb. The contralateral limb was then released and a pigtail catheter is advanced up over the 0.014 wire. Once again, images of the renals were then obtained and final measurements were made. The 28 x 28 x 95 with a suprarenal fixation extender cuff was opened onto the field. It was advanced up the right groin over the superstiff wire. Follow-up angiography was used to demonstrate the left renal, which is the lower renal, and the device is subsequently deployed, positioning it with the fabric just below the left  renal. A Coda balloon was then used to angioplasty the entire system and down into the iliac with a 7 x 4 balloon used on the left side to seal. The pigtail catheter was then readvanced and  angiography performed. No endo- leaks were noted.   The sheath was then removed from the right groin and the right Walters femoral artery repaired with interrupted 6-0 Prolene. The right groin was then closed in multiple layers using 2-0 Vicryl followed by 3-0 Vicryl followed by 4-0 Monocryl subcuticular and Dermabond. Left side was closed with a minx device without complication.   The patient tolerated the procedure well and there were no immediate complications. Sponge and needle counts were correct. He was taken to the recovery room in stable condition.  ____________________________ Katha Cabal, MD ggs:bjt D: 02/10/2011 10:39:59 ET T: 02/10/2011 11:18:39 ET JOB#: [id number] Dolores Lory Bernetha Anschutz MD ELECTRONICALLY SIGNED 02/11/2011 12:29

## 2014-05-17 NOTE — Discharge Summary (Signed)
PATIENT NAME:  Larry Walters, Larry Walters MR#:  [id number] DATE OF BIRTH:  02-13-1950  DATE OF ADMISSION:  02/10/2011 DATE OF DISCHARGE:  02/11/2011  DISCHARGE DIAGNOSIS: Abdominal aortic aneurysm.   SECONDARY DIAGNOSES:  1. 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  2. Hypertension. 3. Chronic obstructive pulmonary disease. 4. Anxiety disorder.   CONSULTATIONS: None.   SURGERIES: Endovascular repair of abdominal aortic aneurysm with treatment of iliac occlusive disease on 02/10/2011.   HISTORY OF PRESENT ILLNESS: Mr. Verdejo is a 64 year old gentleman who presents to the office with increasing size of his abdominal aortic aneurysm as well as worsening claudication symptoms and symptoms consistent with rest pain of the right lower extremity. He is, therefore, undergoing treatment of these conditions simultaneously. Endovascular repair has been recommended.   HOSPITAL COURSE: On the day of admission, he underwent successful endovascular repair of abdominal aortic aneurysm using an Endologix stent graft. Postoperatively, he has had a normal course. Postoperative day one he was sitting up in a chair tolerating a regular diet and voided after removal of his Foley and was felt fit for discharge. He is discharged to home. He will follow up in the office in 1 to 2 weeks with Dr. Lucky Cowboy. Light activities. Prudent diet. Percocet for pain. He is to continue all his other pain medicines.   CONDITION ON DISCHARGE: Improved.  ____________________________ Katha Cabal, MD ggs:ap D: 02/11/2011 11:29:39 ET              T: 02/11/2011 14:16:44 ET                  JOB#: [id number] cc: Rose Fillers. [place], [place] Katha Cabal MD ELECTRONICALLY SIGNED 03/08/2011 11:08

## 2014-05-17 NOTE — H&P (Signed)
PATIENT NAME:  Larry Walters, Larry Walters MR#:  [id number] DATE OF BIRTH:  1950/02/26  DATE OF ADMISSION:  06/20/2011  REFERRING PHYSICIAN: Dr. Lovena Le PRIMARY CARE PHYSICIAN: Rose Fillers. [place], [place] PRIMARY PULMONOLOGIST: Dr. Lake Bells  PRESENTING COMPLAINT: Cough, shortness of breath, wheezing.   HISTORY OF PRESENT ILLNESS: Larry Walters is a 64 year old gentleman with history of abdominal aortic aneurysm status post repair in January 2013, peripheral vascular disease, hypertension, migraines, tobacco use, chronic obstructive pulmonary disease on O2 at bedtime and while watching TV, history of pulmonary nodules, depression and anxiety who presents today with reports of developing cough and shortness of breath about two days ago with worsening difficulty breathing today. Reports that his cough is nonproductive. He is too weak to bring up his sputum. No blood. No chest pain. He reports no improvement with nebulizer treatments at home and increased use of O2. Patient reports that he actually recently completed some antibiotic course with what sounds like Levaquin but starting having recurrence of symptoms two days ago. He endorses some presyncope but no syncope. No palpitations. No sick contact.   PAST MEDICAL HISTORY:  1. Abdominal aortic aneurysm status post repair January 2013.  2. Peripheral vascular disease.  3. Chronic obstructive pulmonary disease on O2 at bedtime and while watching television.  4. Ongoing tobacco use.  5. Hypertension.  6. History of pulmonary nodules being followed by primary care physician.  7. Depression and anxiety.   PAST SURGICAL HISTORY:  1. Left cataract surgery.  2. Aneurysm repair as above.  3. 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   ALLERGIES: Penicillin.   MEDICATIONS:  1. Spiriva daily.  2. Advair 250/50 b.i.d.  3. Proventil HFA as needed.  4. Albuterol nebulizers as needed.  5. Lisinopril 20 mg daily.  6. Aspirin 81 mg daily.  7. B12 5000 mcg daily.   8. Xanax 1 mg b.i.d.   FAMILY HISTORY: Mother died of chronic obstructive pulmonary disease. Father had aneurysm.   SOCIAL HISTORY: He lives in [place] with his wife. Denies any alcohol or drug use. He has ongoing tobacco use, down to five cigarettes per day. He is on disability.   REVIEW OF SYSTEMS: CONSTITUTIONAL: No fevers, nausea, vomiting. EYES: No glaucoma. Has history of cataracts. ENT: No epistaxis, discharge. RESPIRATORY: As per history of present illness. CARDIOVASCULAR: No chest pain, edema, palpitations. Endorses presyncope but no syncope. GASTROINTESTINAL: No nausea, vomiting. He has constipation and takes bowel regimen that causes some diarrhea intermittently. No abdominal pain, hematemesis. He has history of bleeding from hemorrhoids. GENITOURINARY: No dysuria, hematuria. ENDO: No polyuria or polydipsia. HEMATOLOGIC: No easy bleeding. SKIN: No ulcers. MUSCULOSKELETAL: No neck pain, joint swelling. NEUROLOGIC: No history of stroke or seizure. PSYCH: He endorses depression, anxiety and suicidal ideation but no intent.   PHYSICAL EXAMINATION:  VITAL SIGNS: Temperature 98.1, pulse 108, respiratory rate 24, blood pressure 162/79, sating at 97% on 2 liters.   GENERAL: Lying in bed in no apparent distress.   HEENT: Normocephalic, atraumatic. Pupils equal, symmetric. Nasal cannula in place. He has slightly dry mucous membrane.   NECK: Soft and supple. No adenopathy or JVP.   CARDIOVASCULAR: Slightly tachy. No murmurs, rubs, or gallops.   LUNGS: Diffuse wheezing. No use of accessory muscles or increased respiratory effort.   ABDOMEN: Soft. Positive bowel sounds. No mass appreciated.   EXTREMITIES: No edema. Dorsal pedis pulses on the right greater than left.   MUSCULOSKELETAL: No joint effusion.   SKIN: No ulcers.   NEUROLOGIC: No dysarthria  or aphasia. Symmetrical strength. No focal deficit.   PSYCH: He is alert and oriented. Patient is cooperative.   LABORATORY, DIAGNOSTIC  AND RADIOLOGICAL DATA: Troponin less than 0.02. CK 97. MB 2.3. WBC 14.7, hemoglobin 15.5, hematocrit 46, platelet 271, MCV 94, glucose 111, BUN 14, creatinine 0.84, sodium 139, potassium 3.8, chloride 104, carbon dioxide 28, calcium 8.3. Chest x-ray without any evidence of infiltrate. He has changes of chronic obstructive pulmonary disease. EKG with sinus tach of 99. There is incomplete right bundle branch. No ST elevation or depression.   ASSESSMENT AND PLAN: Larry Walters is a 64 year old gentleman with history of chronic obstructive pulmonary disease on O2 at bedtime and while watching TV, history of pulmonary nodules, ongoing tobacco use, hypertension, peripheral vascular disease, abdominal aortic aneurysm status post repair, depression and anxiety with suicidal ideation presenting with cough, shortness of breath and wheezing.  1. Chronic obstructive pulmonary disease exacerbation. Chest x-ray without evidence of infiltrate. Recently completed Levaquin course. Will hold off on additional antibiotics for now and continue with Solu-Medrol, oxygen, DuoNebs, Advair and Spiriva. Follow WBC. If no improvement consider adding on antibiotic regimen. Patient with history of pulmonary nodules being followed by primary care physician.  2. Depression, anxiety with passive suicidal ideation. He denies any intent. Will hold off on a sitter. Resume Xanax and add Ativan as needed. Psych to evaluate for medication adjustments.  3. Peripheral vascular disease/abdominal aortic aneurysm status post repair. Resume aspirin.  4. Hypertension. Resume lisinopril.  5. Prophylaxis with aspirin, Lovenox.   TIME SPENT: Approximately 45 minutes spent on patient care.    ____________________________ Rita Ohara, MD ap:cms D: 06/20/2011 01:52:43 ET T: 06/20/2011 08:32:13 ET JOB#: [id number]  cc: Brien Few Jude Naclerio, MD, <Dictator> Rose Fillers. [place], [place] Rita Ohara MD ELECTRONICALLY SIGNED 06/29/2011 4:05

## 2014-05-17 NOTE — Op Note (Signed)
PATIENT NAME:  Larry Walters, Larry Walters MR#:  [id number] DATE OF BIRTH:  1950-11-09  DATE OF PROCEDURE:  02/10/2011  PREOPERATIVE DIAGNOSES:  1. Abdominal aortic aneurysm.  2. Peripheral arterial disease with claudication.  3. Hypertension.  4. Long-standing tobacco dependence.   POSTOPERATIVE DIAGNOSES:  1. Abdominal aortic aneurysm.  2. Peripheral arterial disease with claudication.  3. Hypertension.  4. Long-standing tobacco dependence.   PROCEDURES:  1. Right femoral artery cutdown by Dr. Lucky Cowboy for placement of endoprosthesis.  2. Catheter placement into aorta from bilateral femoral approaches, right by Dr. Lucky Cowboy and left by Dr. Hortencia Pilar.  3. Placement of Endologix Unibody graft with a 22 mm diameter x 14 mm limb. 4. Placement of an aortic extension cuff 28 mm diameter with suprarenal fixator by Dr. Lucky Cowboy. 5. Mynx closure device, left femoral artery, by Dr. Hortencia Pilar.   SURGEON: Algernon Huxley, MD  ASSISTANT: Hortencia Pilar, MD  ANESTHESIA: General.   ESTIMATED BLOOD LOSS: Approximately 100 mL.  FLUOROSCOPY TIME: 13.6 minutes.  CONTRAST USED: 75 mL.   INDICATION FOR PROCEDURE: This is a 64 year old gentleman with a small abdominal aortic aneurysm and short distance claudication, worse on the right than the left. He had a right iliac stenosis or occlusion and some left iliac stenosis that were concomitant with the abdominal aortic aneurysm. To treat both issues we elected to use the Endologix power length system for treatment of both the occlusive and aneurysmal disease. Risks and benefits were discussed with the patient and informed consent was obtained.   DESCRIPTION OF PROCEDURE: The patient was brought to the vascular interventional radiology suite with help of our anesthesia colleagues providing a general anesthetic. We serially sterilely prepped and draped his abdomen and groins and a sterile surgical field was created. Dr. Hortencia Pilar got access to the left femoral  artery under ultrasound guidance and placed an 8 French sheath, and I performed a femoral artery cutdown on the right and encircled with the vessel with vessel loops proximally and distally. The patient was systemically heparinized with 5000 units of intravenous heparin and I placed a 6 French sheath on my side. He had a right iliac stenosis or occlusion and Dr. Delana Meyer placed a pigtail catheter in the aorta and took a quick picture. I was able to cross this without difficulty with a stiff angled Glidewire and a Kumpe catheter. I then exchanged for a Lunderquist wire. We performed kissing balloon angioplasty of the common iliac arteries for treatment of the stenosis on each side, worse on the right, with 7 mm diameter angioplasty balloons. Following this I was able to place the large sheath up the right side without difficulty. The 22 x 80 with 13 mm diameter and 40 mm length iliac limbs was placed through this. Dr. Delana Meyer snared the contralateral wire and pulled it out the left femoral sheath. We then deployed the graft and Dr. Delana Meyer placed an 0.014 [facility] wire in the contralateral limb and completed deployment of the left side of the device. I then used a 28 mm diameter x 95 mm total length suprarenal aortic cuff and performed an angiogram to visualize the renal arch with the left being lower. This was then deployed just at the base of the left renal artery. The extension delivery device was removed from the FX sheath and advanced a Coda balloon to approximately the stent graft ballooned throughout the stent graft body into the ipsilateral limb. Kissing balloon angioplasty was performed with a 7 mm diameter noncompliant  balloon through Dr. Nino Parsley side while I used the [place] balloon on the iliac on the right. On completion angiogram there were no visible endoleaks. Retrograde arteriogram through both femoral sheaths showed improved flow through both iliac systems without residual high-grade stenosis. At  this point, we elected to terminate the procedure. Dr. Delana Meyer performed  a Mynx closure device on the left femoral artery with good hemostatic result. I closed the right femoral artery with five interrupted 6-0 Prolene sutures and then closed the wound in layered fashion        with 2-0 Vicryl, two layers with 3-0 Vicryl and a 4-0 Monocryl. A sterile dressing was placed. The patient tolerated the procedure well and was taken to the Recovery Room in stable condition.  ____________________________ Algernon Huxley, MD jsd:slb D: 02/10/2011 09:49:00 ET T: 02/10/2011 10:06:48 ET JOB#: [id number]  cc: Algernon Huxley, MD, <Dictator> Algernon Huxley MD ELECTRONICALLY SIGNED 03/08/2011 11:13

## 2014-08-06 IMAGING — CR DG CHEST 2V
1 series · 2 of 2 positions shown · non-contrast
Comparison: none

REASON FOR EXAM: shortness of breath
COMMENTS:

PROCEDURE:     KDR - KDXR CHEST PA (OR AP) AND LAT  - June 19, 2012 [DATE]
RESULT:     Comparison is made to prior study dated 06/19/2011.

[Series 1: pa · 0.17mm/px · 2 of 2 slices shown]
[im 1/2]
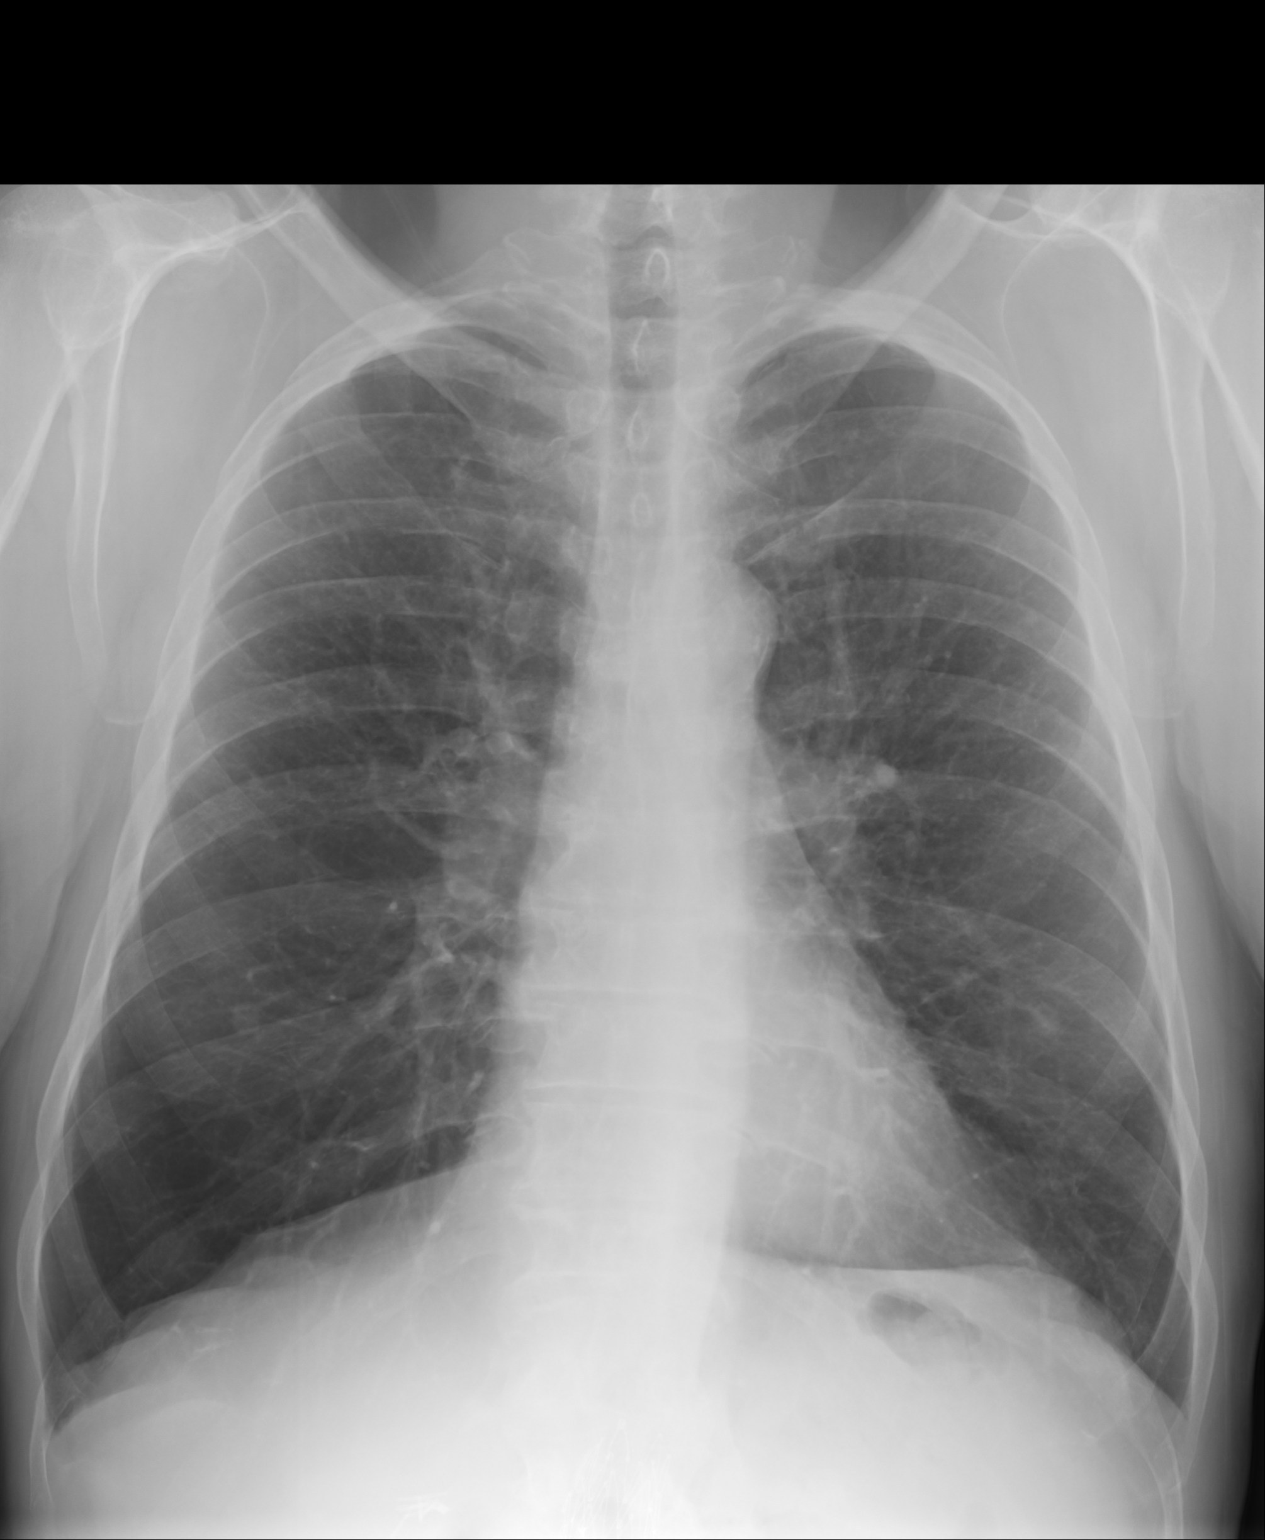
[im 2/2]
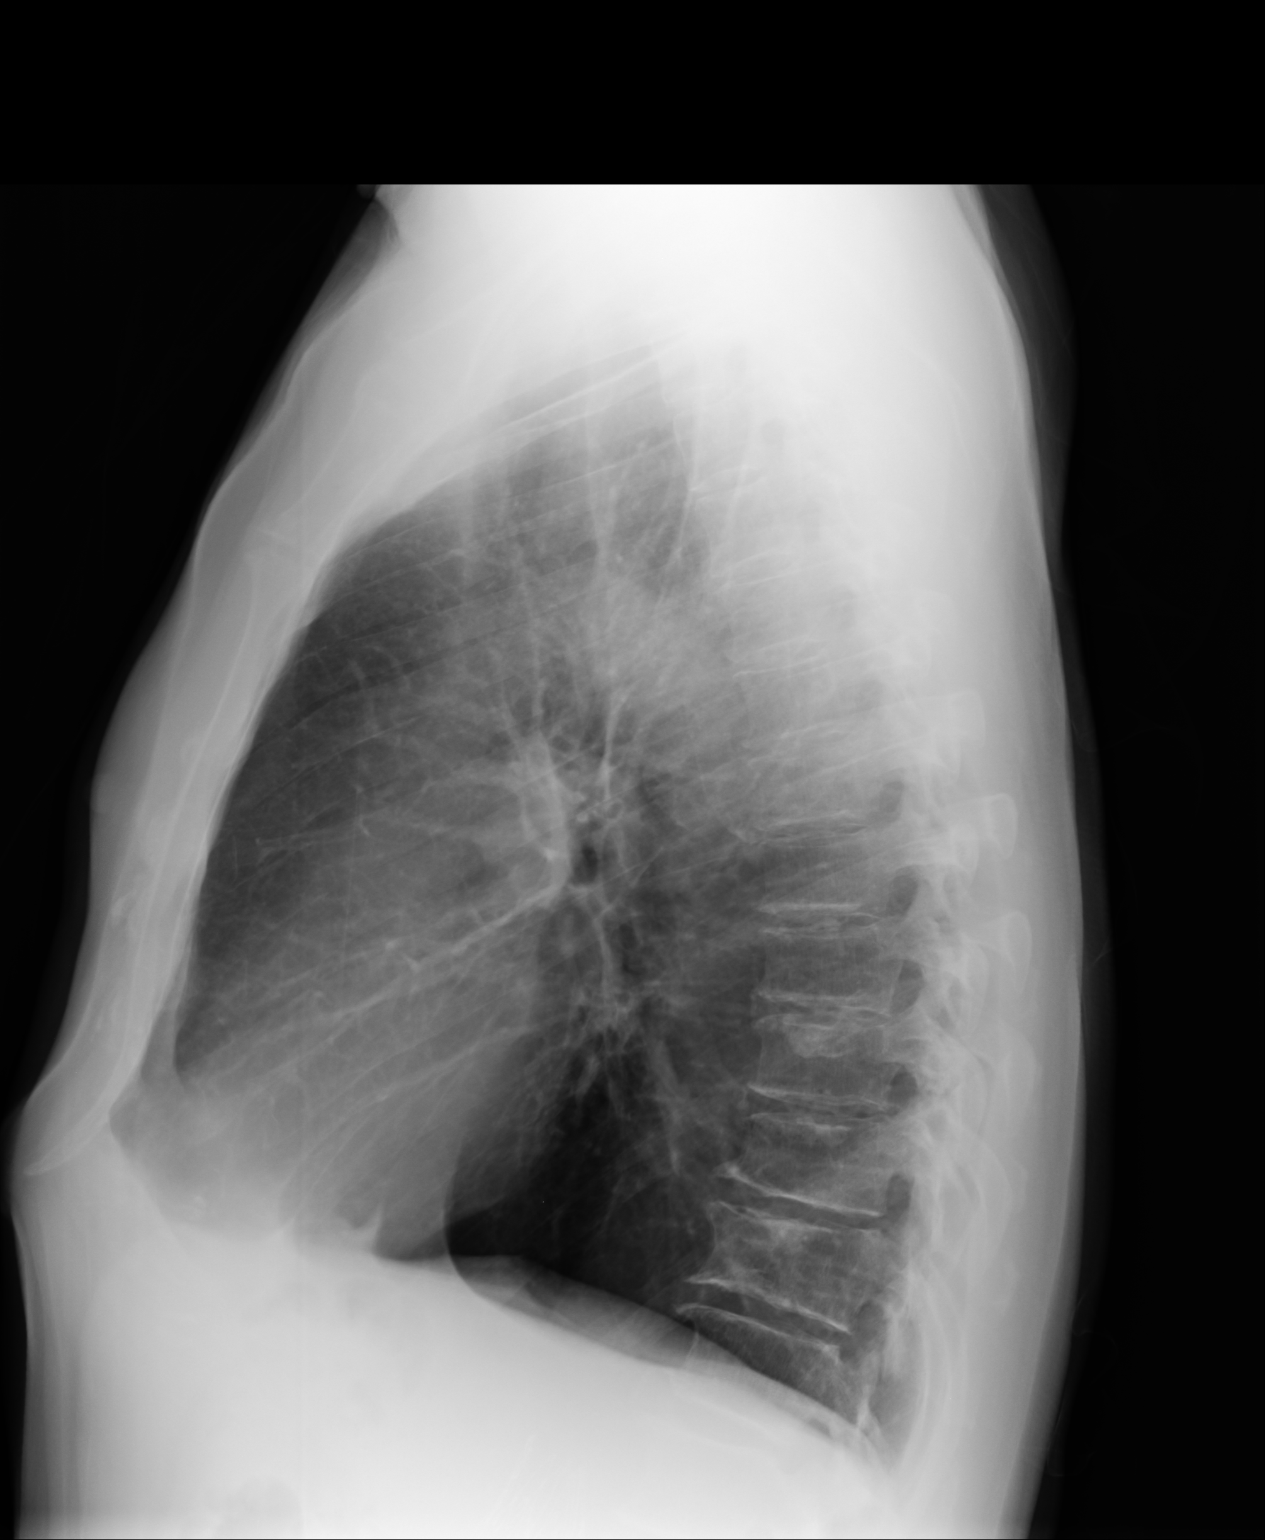

[2 of 2 positions shown; findings below may reference images not displayed]

FINDINGS: The lungs are hyperinflated. No focal regions of consolidation or
focal infiltrates are appreciated. The cardiac silhouette and visualized
bony skeleton are unremarkable.
IMPRESSION: COPD without evidence of acute cardiopulmonary disease.

## 2014-09-20 IMAGING — CR DG CHEST 2V
1 series · 2 of 2 positions shown · non-contrast
Comparison: none

REASON FOR EXAM: SOB
COMMENTS:

[Series 1: pa · 0.17mm/px · 2 of 2 slices shown]
[im 1/2]
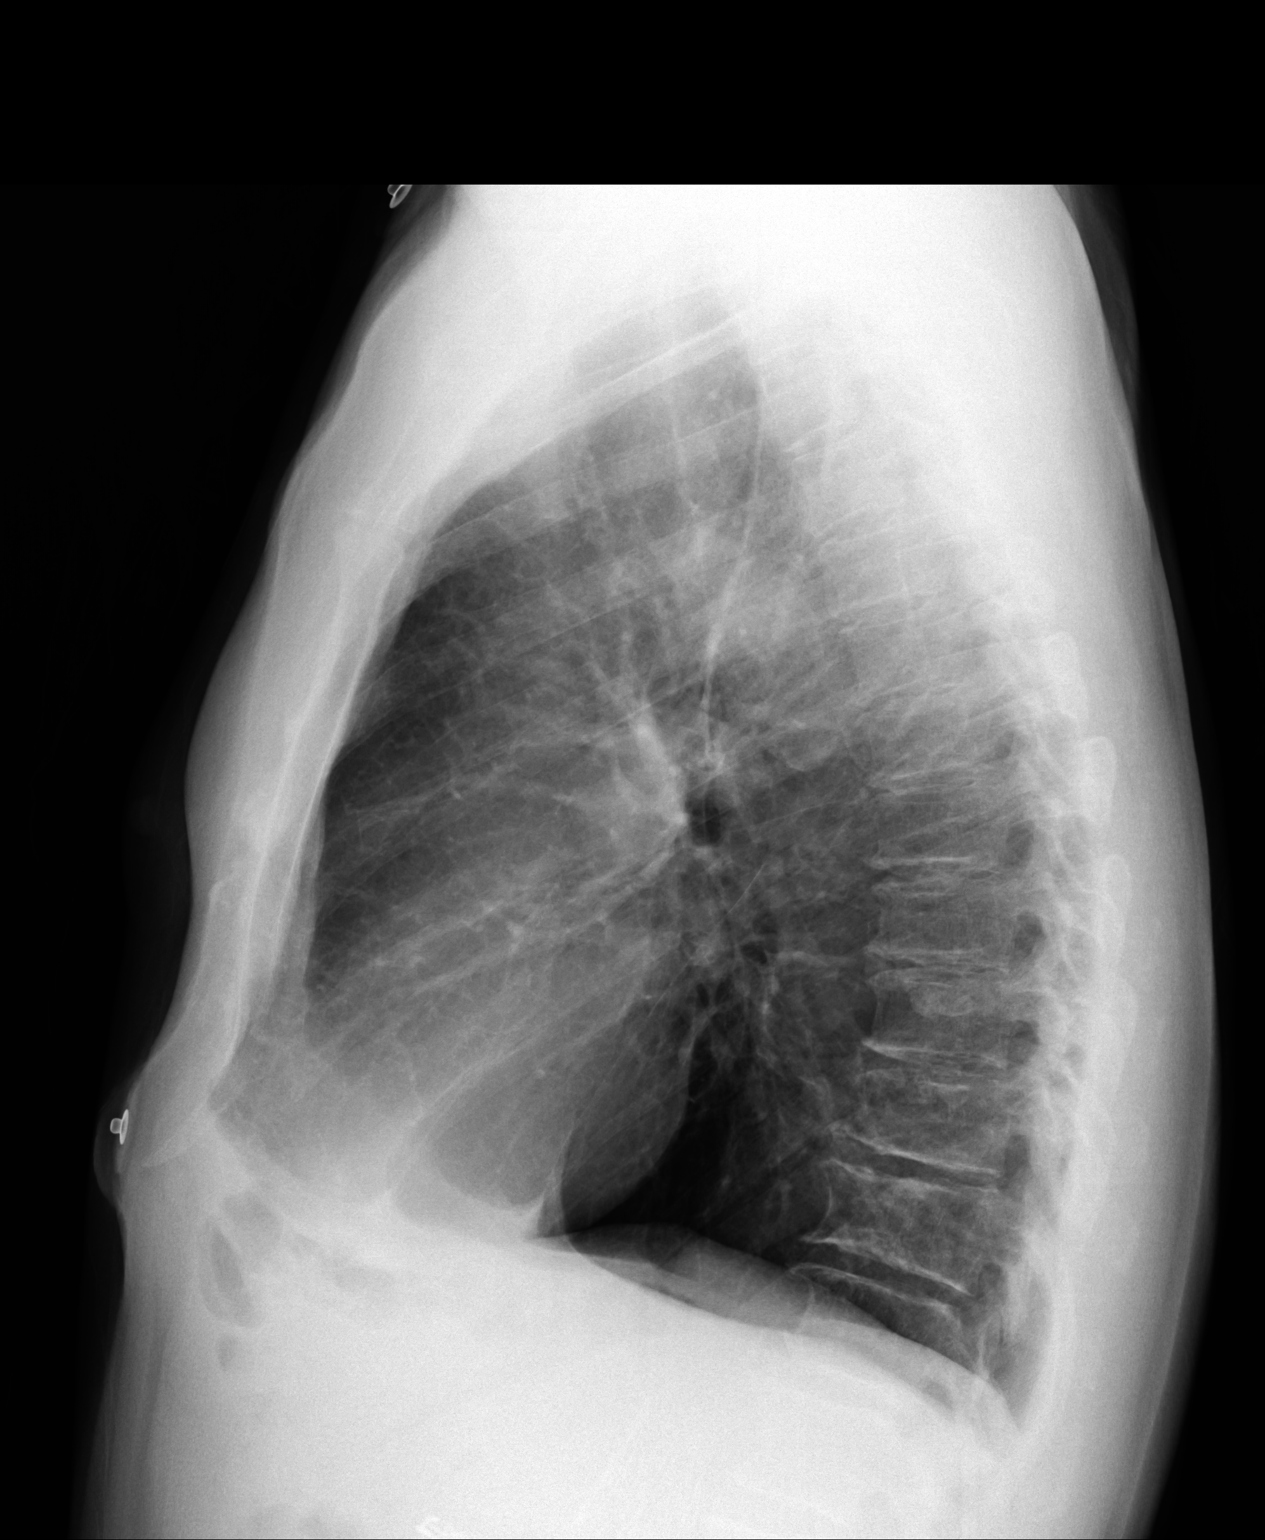
[im 2/2]
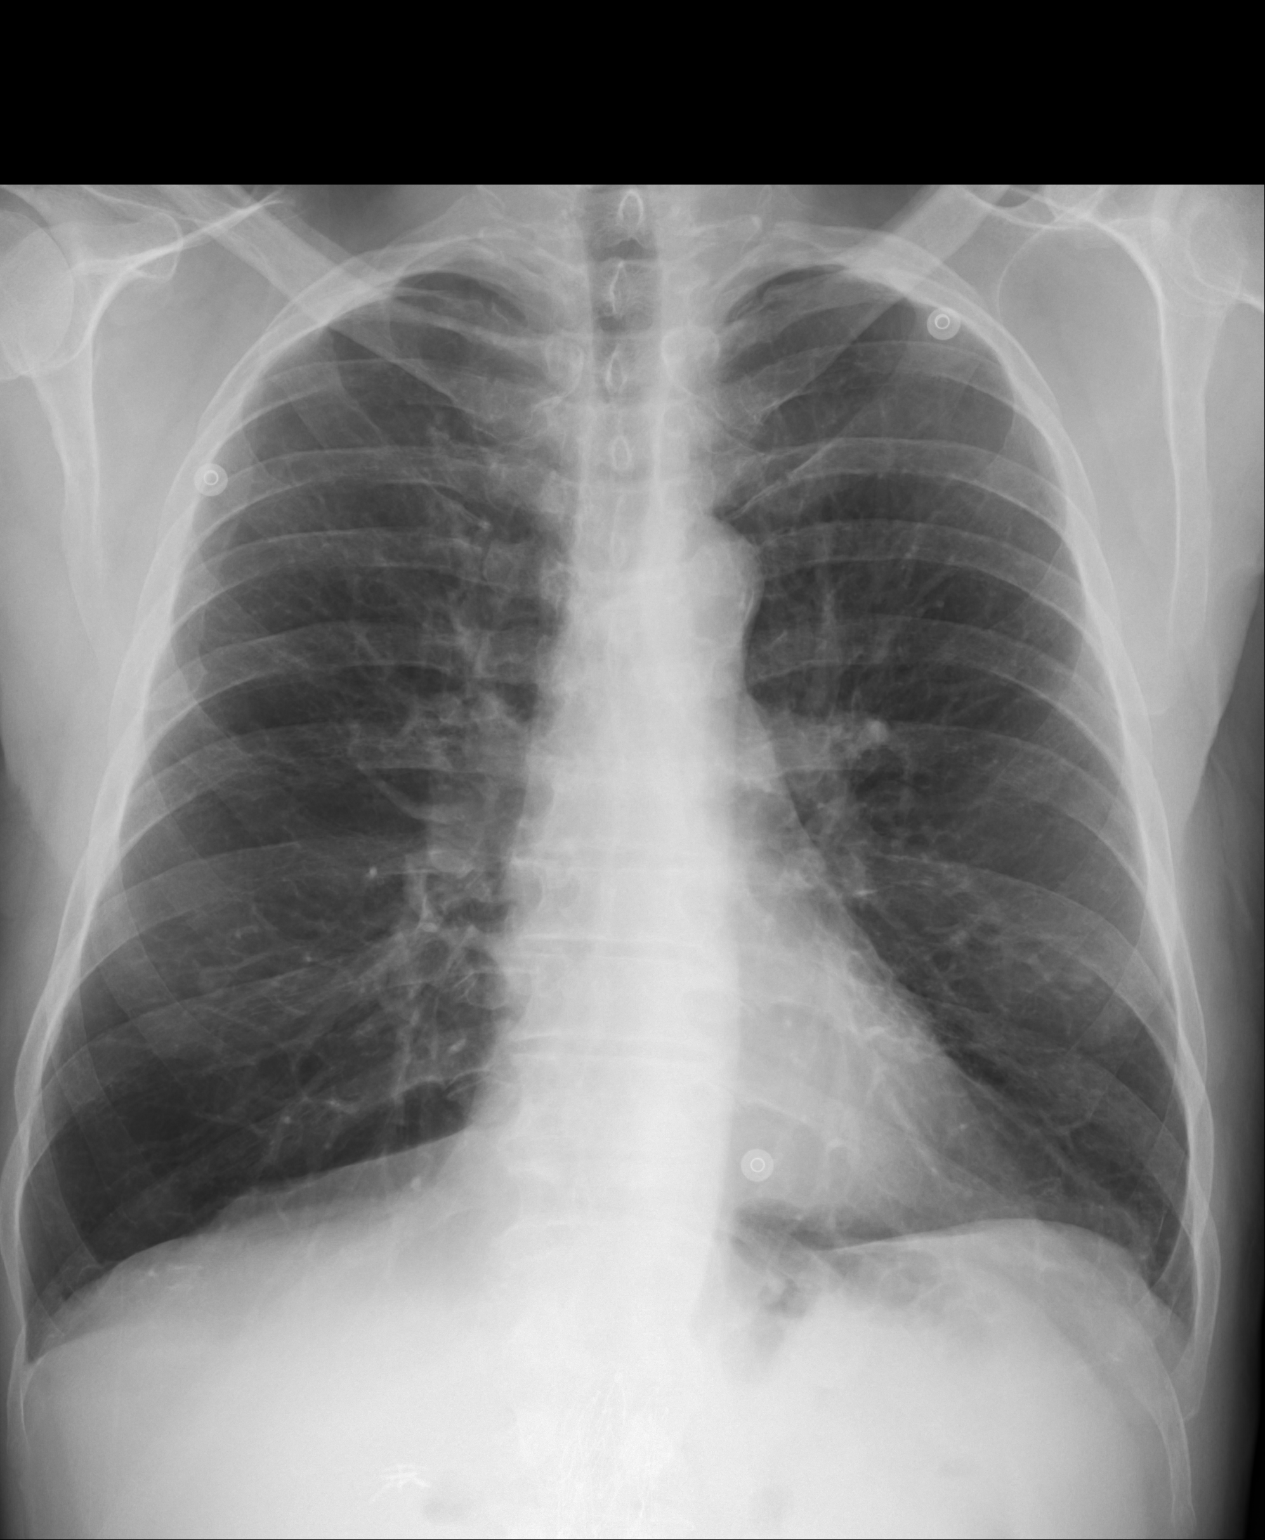

[2 of 2 positions shown; findings below may reference images not displayed]

PROCEDURE:     DXR - DXR CHEST PA (OR AP) AND LATERAL  - August 03, 2012 [DATE]

RESULT:     Comparison is made to the exam dated 06/19/2012.

The lungs are hyperinflated consistent with COPD. The lungs are clear. The
heart and pulmonary vessels are normal. The bony and mediastinal structures
are unremarkable. There is no effusion. There is no pneumothorax or evidence
of congestive failure.
IMPRESSION: No acute cardiopulmonary disease. The hyperinflation of the
lungs consistent with COPD. Stable appearance.

[REDACTED]

## 2014-09-20 IMAGING — CT CT CHEST W/ CM
2 series · 15 of 31 positions shown, 19 images · IV contrast (APPLIED)
Comparison: none

REASON FOR EXAM: long time smoker with hypoxia, dyspnea and hemoptysis
COMMENTS:

[Series 4: soft tissue · axial · 0.78mm/px · z∈[+694,+742]mm · 2 of 106 slices shown]
[im 9/106  mediastinal]
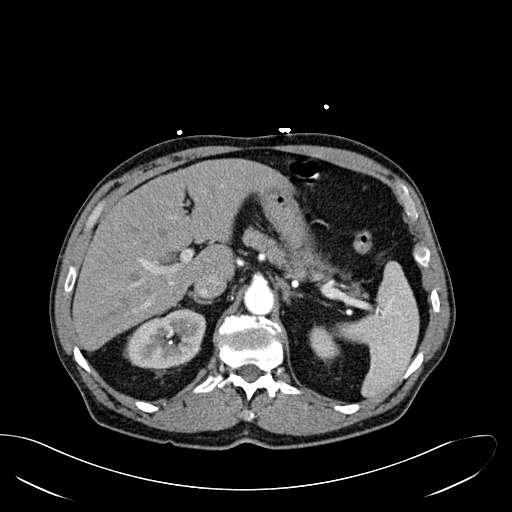
[im 25/106  mediastinal]
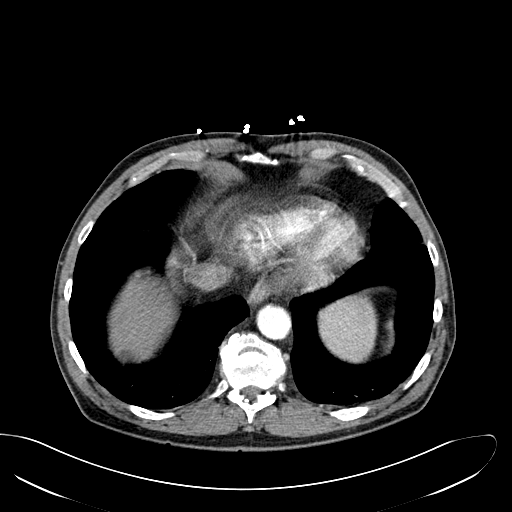

[Series 5: lung windows · axial · 0.78mm/px · z∈[+700,+960]mm · 13 of 103 slices shown, 17 images]
[im 8/103  mediastinal]
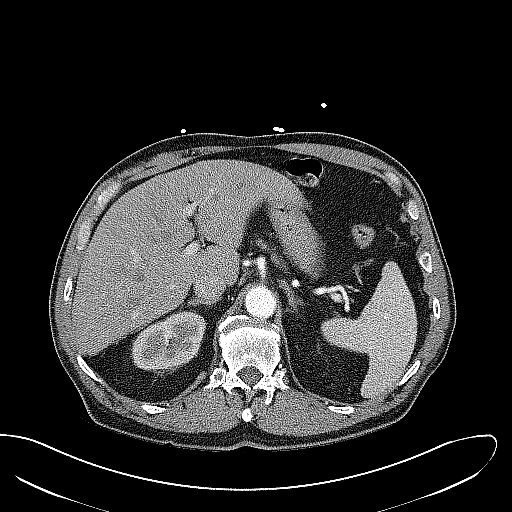
[im 8/103  lung]
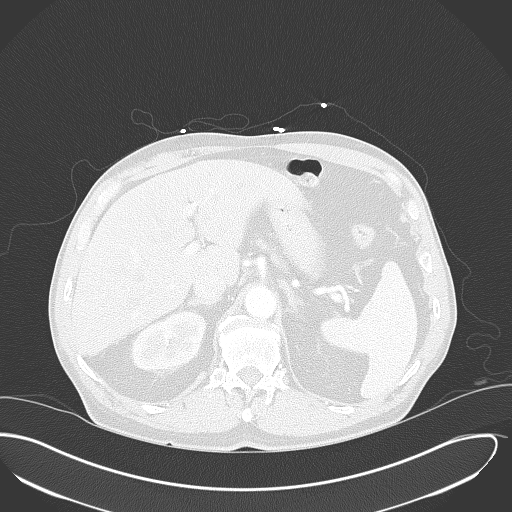
[im 16/103  lung]
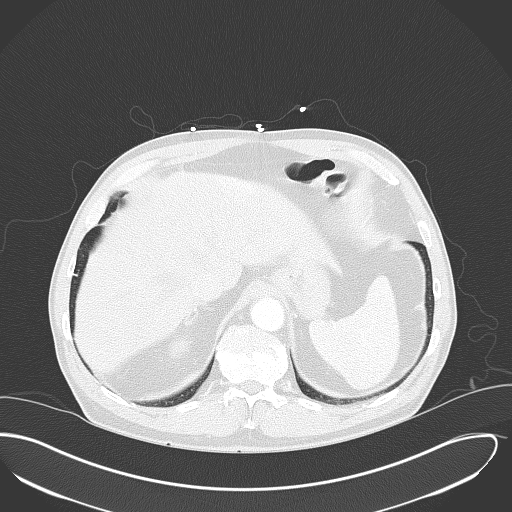
[im 24/103  lung]
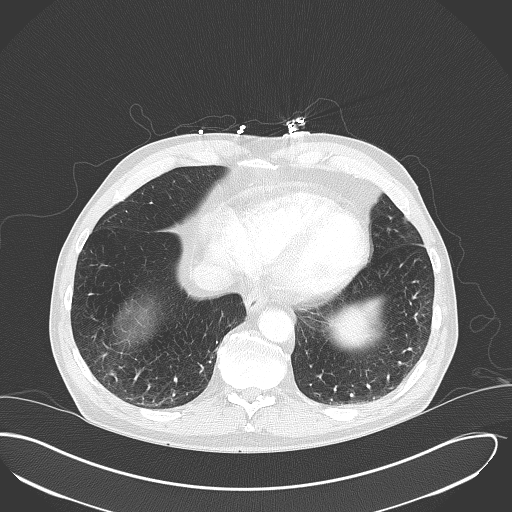
[im 32/103  lung]
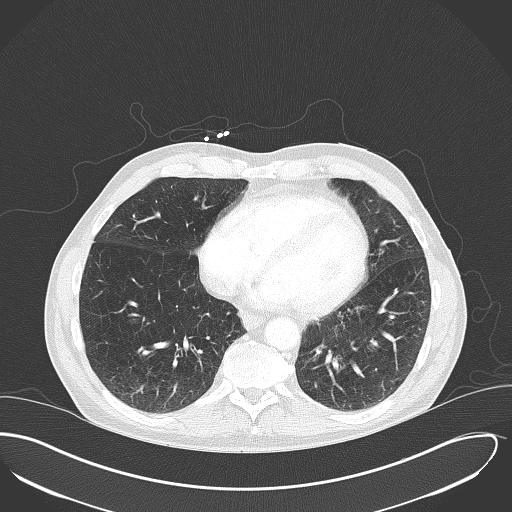
[im 40/103  mediastinal]
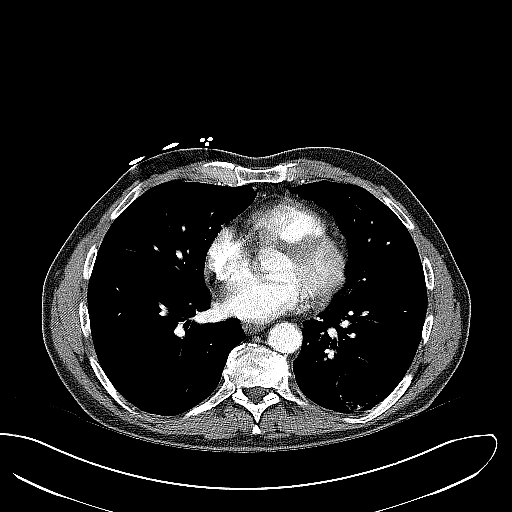
[im 40/103  lung]
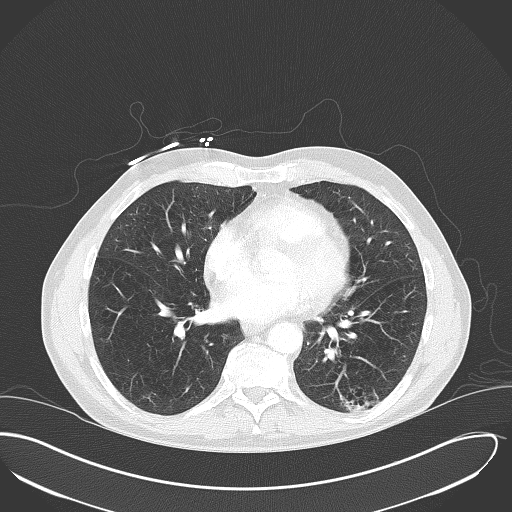
[im 48/103  lung]
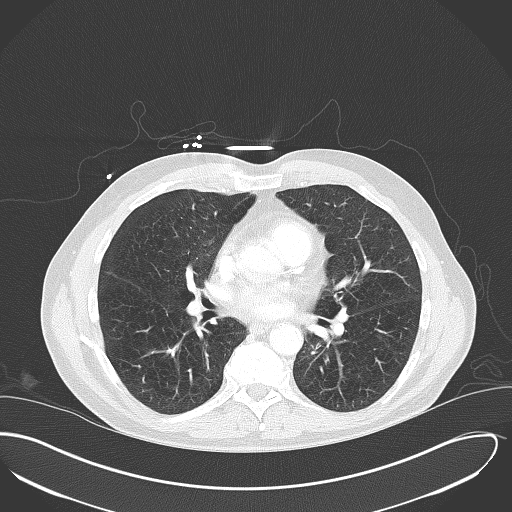
[im 52/103  lung]
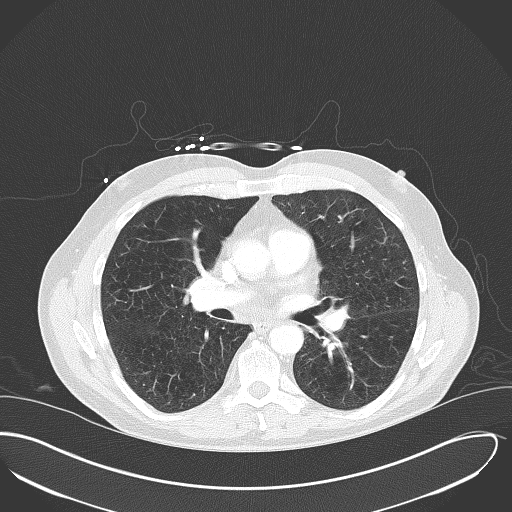
[im 55/103  lung]
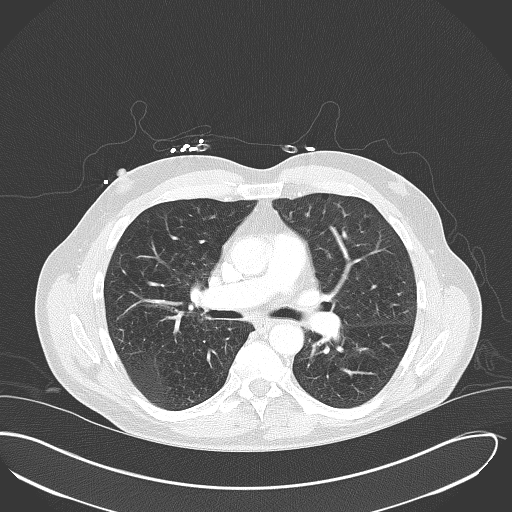
[im 63/103  mediastinal]
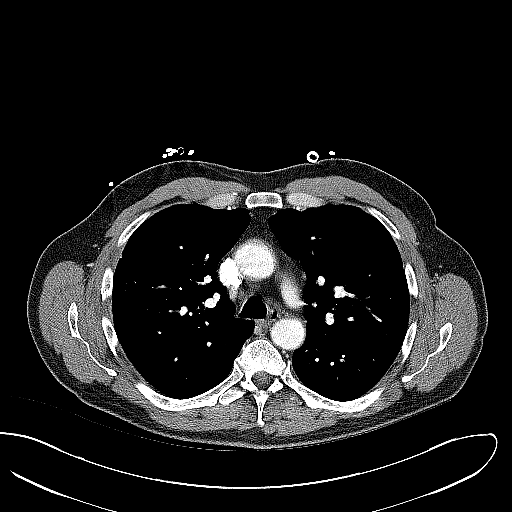
[im 63/103  lung]
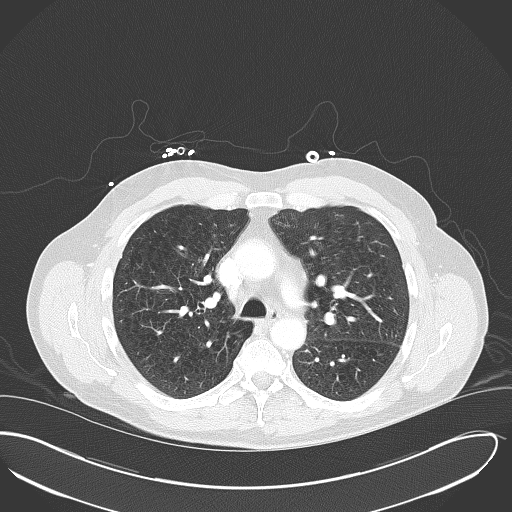
[im 71/103  lung]
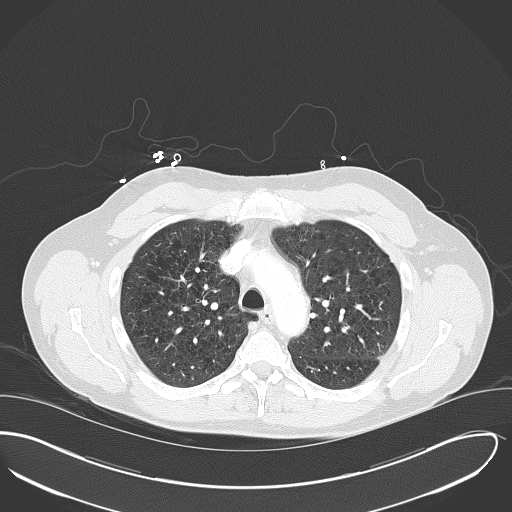
[im 79/103  lung]
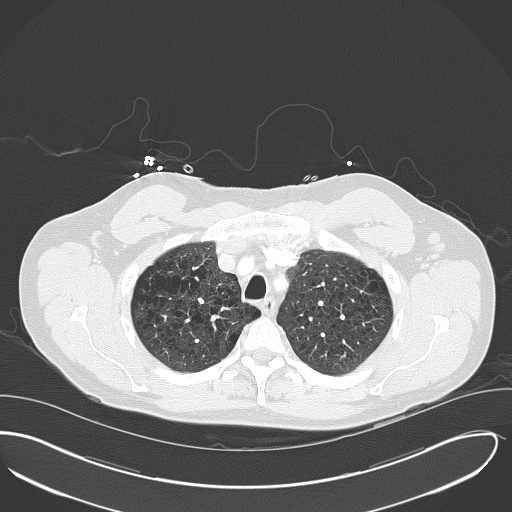
[im 87/103  lung]
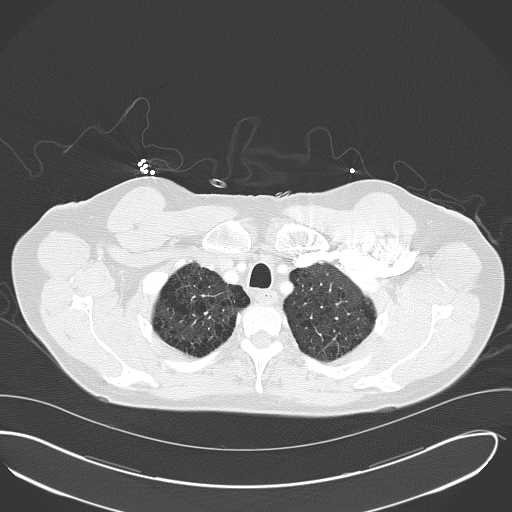
[im 95/103  mediastinal]
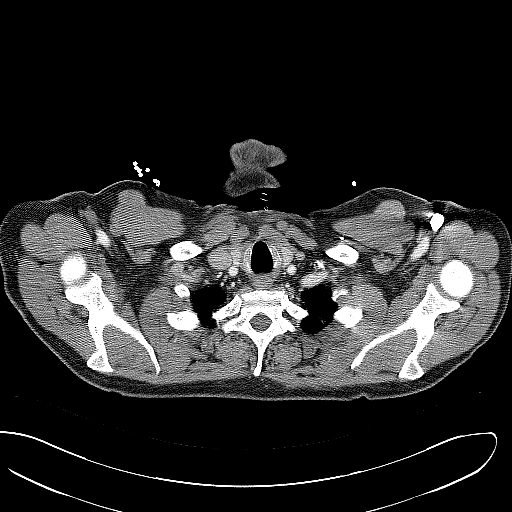
[im 95/103  lung]
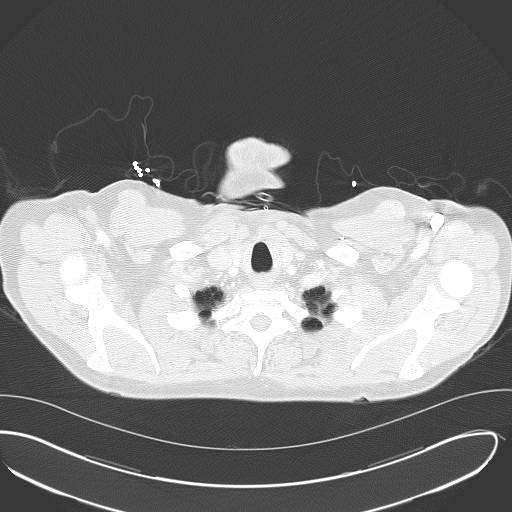

[15 of 31 positions shown; findings below may reference images not displayed]

PROCEDURE:     CT  - CT CHEST (FOR PE) W  - August 03, 2012 [DATE]

RESULT:     CT of the chest is performed with 100 mL of 9sovue-JFQ iodinated
intravenous contrast with images reconstructed at 3.0 mm slice thickness in
the axial plane compared previous exam of 02/28/2012.

The patient has a current chest x-ray showing evidence of COPD. As
demonstrated on the previous CT examination there is certainly centrilobular
emphysematous disease. There is some respiratory motion artifact. The
thoracic aorta appears normal in caliber without evidence of dissection. The
adrenal glands appear to be unremarkable. The included upper abdominal
viscera appear to be unremarkable with the exception of nonobstructing upper
pole right renal calculus measuring 2 to 3 mm. There is no pleural or
pericardial effusion. The pulmonary arterial system demonstrates excellent
opacification without evidence of pulmonary embolism. The lung window images
demonstrate subpleural areas of interstitial thickening and fibrosis as well
as atelectasis. No definite infiltrate or significant edema is evident.
There is no pneumothorax.
IMPRESSION: 1. Emphysematous lung disease with fibrotic changes.
2. No thoracic aortic aneurysm or dissection.
3. No evidence of pulmonary embolism.

[REDACTED]
